# Patient Record
Sex: Female | Born: 1965 | Race: White | Hispanic: No | Smoking: Former smoker
Health system: Southern US, Community
[De-identification: ages and names within clinical notes are randomized; demographics above are authoritative.]

## PROBLEM LIST (undated history)

## (undated) DIAGNOSIS — I341 Nonrheumatic mitral (valve) prolapse: Secondary | ICD-10-CM

## (undated) DIAGNOSIS — I493 Ventricular premature depolarization: Secondary | ICD-10-CM

## (undated) DIAGNOSIS — D689 Coagulation defect, unspecified: Secondary | ICD-10-CM

## (undated) DIAGNOSIS — D6852 Prothrombin gene mutation: Secondary | ICD-10-CM

## (undated) DIAGNOSIS — T7840XA Allergy, unspecified, initial encounter: Secondary | ICD-10-CM

## (undated) DIAGNOSIS — B009 Herpesviral infection, unspecified: Secondary | ICD-10-CM

## (undated) DIAGNOSIS — F419 Anxiety disorder, unspecified: Secondary | ICD-10-CM

## (undated) DIAGNOSIS — S92909A Unspecified fracture of unspecified foot, initial encounter for closed fracture: Secondary | ICD-10-CM

## (undated) DIAGNOSIS — S62609A Fracture of unspecified phalanx of unspecified finger, initial encounter for closed fracture: Secondary | ICD-10-CM

## (undated) HISTORY — PX: HAND SURGERY: SHX662

## (undated) HISTORY — DX: Allergy, unspecified, initial encounter: T78.40XA

## (undated) HISTORY — PX: TONSILLECTOMY: SUR1361

## (undated) HISTORY — PX: HERNIA REPAIR: SHX51

## (undated) HISTORY — PX: COLONOSCOPY: SHX174

## (undated) HISTORY — PX: BUNIONECTOMY: SHX129

## (undated) HISTORY — DX: Coagulation defect, unspecified: D68.9

## (undated) HISTORY — DX: Prothrombin gene mutation: D68.52

## (undated) HISTORY — PX: PELVIC LAPAROSCOPY: SHX162

## (undated) HISTORY — DX: Nonrheumatic mitral (valve) prolapse: I34.1

## (undated) HISTORY — DX: Anxiety disorder, unspecified: F41.9

## (undated) HISTORY — DX: Fracture of unspecified phalanx of unspecified finger, initial encounter for closed fracture: S62.609A

## (undated) HISTORY — DX: Herpesviral infection, unspecified: B00.9

## (undated) HISTORY — DX: Unspecified fracture of unspecified foot, initial encounter for closed fracture: S92.909A

## (undated) HISTORY — PX: INGUINAL HERNIA REPAIR: SHX194

---

## 1998-12-22 ENCOUNTER — Other Ambulatory Visit: Admission: RE | Admit: 1998-12-22 | Discharge: 1998-12-22 | Payer: Self-pay | Admitting: Gynecology

## 1998-12-27 ENCOUNTER — Other Ambulatory Visit: Admission: RE | Admit: 1998-12-27 | Discharge: 1998-12-27 | Payer: Self-pay | Admitting: Gynecology

## 1999-01-17 ENCOUNTER — Ambulatory Visit (HOSPITAL_COMMUNITY): Admission: RE | Admit: 1999-01-17 | Discharge: 1999-01-17 | Payer: Self-pay | Admitting: Gynecology

## 1999-01-17 ENCOUNTER — Encounter: Payer: Self-pay | Admitting: Gynecology

## 2000-05-21 ENCOUNTER — Other Ambulatory Visit: Admission: RE | Admit: 2000-05-21 | Discharge: 2000-05-21 | Payer: Self-pay | Admitting: Gynecology

## 2001-01-12 ENCOUNTER — Ambulatory Visit (HOSPITAL_COMMUNITY): Admission: RE | Admit: 2001-01-12 | Discharge: 2001-01-12 | Payer: Self-pay | Admitting: Emergency Medicine

## 2001-05-01 ENCOUNTER — Ambulatory Visit (HOSPITAL_COMMUNITY): Admission: RE | Admit: 2001-05-01 | Discharge: 2001-05-01 | Payer: Self-pay | Admitting: Gynecology

## 2001-06-03 ENCOUNTER — Other Ambulatory Visit: Admission: RE | Admit: 2001-06-03 | Discharge: 2001-06-03 | Payer: Self-pay | Admitting: Gynecology

## 2001-06-09 ENCOUNTER — Emergency Department (HOSPITAL_COMMUNITY): Admission: EM | Admit: 2001-06-09 | Discharge: 2001-06-09 | Payer: Self-pay | Admitting: *Deleted

## 2001-06-09 ENCOUNTER — Encounter: Payer: Self-pay | Admitting: Emergency Medicine

## 2002-11-18 ENCOUNTER — Other Ambulatory Visit: Admission: RE | Admit: 2002-11-18 | Discharge: 2002-11-18 | Payer: Self-pay | Admitting: Gynecology

## 2003-04-06 ENCOUNTER — Encounter: Payer: Self-pay | Admitting: Emergency Medicine

## 2003-04-06 ENCOUNTER — Emergency Department (HOSPITAL_COMMUNITY): Admission: EM | Admit: 2003-04-06 | Discharge: 2003-04-06 | Payer: Self-pay | Admitting: Emergency Medicine

## 2003-11-29 ENCOUNTER — Other Ambulatory Visit: Admission: RE | Admit: 2003-11-29 | Discharge: 2003-11-29 | Payer: Self-pay | Admitting: Gynecology

## 2005-01-24 ENCOUNTER — Other Ambulatory Visit: Admission: RE | Admit: 2005-01-24 | Discharge: 2005-01-24 | Payer: Self-pay | Admitting: Gynecology

## 2005-05-29 ENCOUNTER — Encounter: Admission: RE | Admit: 2005-05-29 | Discharge: 2005-05-29 | Payer: Self-pay | Admitting: Internal Medicine

## 2005-11-14 ENCOUNTER — Encounter: Admission: RE | Admit: 2005-11-14 | Discharge: 2005-11-14 | Payer: Self-pay | Admitting: Internal Medicine

## 2005-12-03 ENCOUNTER — Encounter (INDEPENDENT_AMBULATORY_CARE_PROVIDER_SITE_OTHER): Payer: Self-pay | Admitting: *Deleted

## 2005-12-03 ENCOUNTER — Ambulatory Visit (HOSPITAL_BASED_OUTPATIENT_CLINIC_OR_DEPARTMENT_OTHER): Admission: RE | Admit: 2005-12-03 | Discharge: 2005-12-03 | Payer: Self-pay | Admitting: Orthopedic Surgery

## 2006-03-20 ENCOUNTER — Other Ambulatory Visit: Admission: RE | Admit: 2006-03-20 | Discharge: 2006-03-20 | Payer: Self-pay | Admitting: Gynecology

## 2006-07-21 ENCOUNTER — Ambulatory Visit: Payer: Self-pay | Admitting: Family Medicine

## 2006-07-22 ENCOUNTER — Ambulatory Visit: Payer: Self-pay | Admitting: Family Medicine

## 2006-07-23 ENCOUNTER — Encounter: Admission: RE | Admit: 2006-07-23 | Discharge: 2006-07-23 | Payer: Self-pay | Admitting: Family Medicine

## 2006-07-30 ENCOUNTER — Ambulatory Visit: Payer: Self-pay | Admitting: Family Medicine

## 2006-07-31 ENCOUNTER — Ambulatory Visit: Payer: Self-pay

## 2006-07-31 ENCOUNTER — Encounter: Payer: Self-pay | Admitting: Cardiology

## 2007-03-24 ENCOUNTER — Other Ambulatory Visit: Admission: RE | Admit: 2007-03-24 | Discharge: 2007-03-24 | Payer: Self-pay | Admitting: Gynecology

## 2007-06-30 ENCOUNTER — Encounter: Admission: RE | Admit: 2007-06-30 | Discharge: 2007-06-30 | Payer: Self-pay | Admitting: Gynecology

## 2008-04-29 ENCOUNTER — Encounter: Admission: RE | Admit: 2008-04-29 | Discharge: 2008-04-29 | Payer: Self-pay | Admitting: Gynecology

## 2008-06-15 ENCOUNTER — Encounter: Admission: RE | Admit: 2008-06-15 | Discharge: 2008-07-05 | Payer: Self-pay | Admitting: Orthopedic Surgery

## 2008-06-22 ENCOUNTER — Encounter: Payer: Self-pay | Admitting: Gynecology

## 2008-06-22 ENCOUNTER — Other Ambulatory Visit: Admission: RE | Admit: 2008-06-22 | Discharge: 2008-06-22 | Payer: Self-pay | Admitting: Gynecology

## 2008-06-22 ENCOUNTER — Ambulatory Visit: Payer: Self-pay | Admitting: Gynecology

## 2009-06-30 ENCOUNTER — Ambulatory Visit: Payer: Self-pay | Admitting: Gynecology

## 2009-06-30 ENCOUNTER — Other Ambulatory Visit: Admission: RE | Admit: 2009-06-30 | Discharge: 2009-06-30 | Payer: Self-pay | Admitting: Gynecology

## 2009-06-30 ENCOUNTER — Encounter: Payer: Self-pay | Admitting: Gynecology

## 2009-07-11 ENCOUNTER — Encounter: Admission: RE | Admit: 2009-07-11 | Discharge: 2009-07-11 | Payer: Self-pay | Admitting: Gynecology

## 2009-09-20 ENCOUNTER — Ambulatory Visit: Payer: Self-pay | Admitting: Gynecology

## 2010-07-13 ENCOUNTER — Encounter: Admission: RE | Admit: 2010-07-13 | Discharge: 2010-07-13 | Payer: Self-pay | Admitting: Gynecology

## 2010-07-17 ENCOUNTER — Ambulatory Visit: Payer: Self-pay | Admitting: Gynecology

## 2010-07-17 ENCOUNTER — Other Ambulatory Visit: Admission: RE | Admit: 2010-07-17 | Discharge: 2010-07-17 | Payer: Self-pay | Admitting: Gynecology

## 2010-11-11 ENCOUNTER — Encounter: Payer: Self-pay | Admitting: Internal Medicine

## 2011-01-14 ENCOUNTER — Ambulatory Visit (INDEPENDENT_AMBULATORY_CARE_PROVIDER_SITE_OTHER): Payer: 59 | Admitting: Gynecology

## 2011-01-14 DIAGNOSIS — K4090 Unilateral inguinal hernia, without obstruction or gangrene, not specified as recurrent: Secondary | ICD-10-CM

## 2011-01-14 DIAGNOSIS — R1032 Left lower quadrant pain: Secondary | ICD-10-CM

## 2011-01-15 ENCOUNTER — Other Ambulatory Visit: Payer: 59

## 2011-01-15 ENCOUNTER — Ambulatory Visit (INDEPENDENT_AMBULATORY_CARE_PROVIDER_SITE_OTHER): Payer: 59 | Admitting: Gynecology

## 2011-01-15 DIAGNOSIS — N72 Inflammatory disease of cervix uteri: Secondary | ICD-10-CM

## 2011-01-15 DIAGNOSIS — N949 Unspecified condition associated with female genital organs and menstrual cycle: Secondary | ICD-10-CM

## 2011-01-15 DIAGNOSIS — R1032 Left lower quadrant pain: Secondary | ICD-10-CM

## 2011-01-15 DIAGNOSIS — D3910 Neoplasm of uncertain behavior of unspecified ovary: Secondary | ICD-10-CM

## 2011-02-13 ENCOUNTER — Encounter (INDEPENDENT_AMBULATORY_CARE_PROVIDER_SITE_OTHER): Payer: 59 | Admitting: Vascular Surgery

## 2011-02-13 DIAGNOSIS — I83893 Varicose veins of bilateral lower extremities with other complications: Secondary | ICD-10-CM

## 2011-02-14 NOTE — Consult Note (Signed)
NEW PATIENT CONSULTATION  Schnick, Andreyah L DOB:  1966-10-01                                       02/13/2011 EAVWU#:[id number]  Patient presents today for evaluation of venous pathology in her lower extremities.  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  She does not have any history of DVT and no history of swelling.  She has not had any hemorrhage from these.  She does have a very strong maternal family history of venous varicosities requiring multiple surgeries and also has a history of lower extremity thrombosis in both her mother and her father.  PAST MEDICAL HISTORY:  Significant for bunionectomies in the past, hernia repair, endometriosis, laparoscopic repair, and tonsillectomy.  SOCIAL HISTORY:  She is single.  She has 1 child.  She works as a Psychologist, prison and probation services.  She does not smoke, having quit 4 years ago, and does have occasional wine with dinner.  REVIEW OF SYSTEMS:  No history of weight loss or gain.  She weighs 140 pounds.  She is 5 feet 6 inches tall.  She does have pain in her popliteal fossa with prolonged standing, otherwise review of systems is completely negative.  MEDICATIONS:  Zyrtec as needed.  ALLERGIES:  CODEINE.  PHYSICAL EXAMINATION:  A well-developed and well-nourished white female appearing her stated age in no acute distress.  Blood pressure 112/63, pulse 73, respirations 18.  HEENT:  Normal.  Her radial and dorsalis pedis pulses are 2+ bilaterally.  Musculoskeletal:  No major deformities or cyanosis.  Neurologic:  No focal weakness or paresthesias.  Skin without ulcers or rashes.  She does have a typical pattern of branching and distribution of telangiectasia on both bilateral thighs.  She also has a very prominent nest of telangiectasia behind  her left popliteal fossa.  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  I imaged her saphenous veins in the area of the popliteal fossa with handheld SonoSite.  This shows no evidence of varicosities in her popliteal fossae bilaterally with no evidence of reflux in her small saphenous vein.  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  I discussed this at length with patient.  I do not see any relationship between the telangiectasia or the discomfort and her reflux.  I explained that the saphenous vein could be treated but certainly would recommend against this, since I do not see any symptoms related to this. I explained that this is progressive, and over time this may require treatment but certainly nothing currently.  Regarding the telangiectasia, I did explain treatment options with sclerotherapy.  She had attempted sclerotherapy with hypertonic saline in the past, but it was very uncomfortable.  I did not feel that she had a good result.  I explained that we would treat her with a different solution that would not have the pain with instillation, and should have a better result. She is interested in pursuing this and will call to schedule with Clementeen Hoof, RN.    Larina Earthly, M.D. Electronically Signed  TFE/MEDQ  D:  02/13/2011  T:  02/14/2011  Job:  [id number]

## 2011-02-27 ENCOUNTER — Encounter (INDEPENDENT_AMBULATORY_CARE_PROVIDER_SITE_OTHER): Payer: Self-pay | Admitting: Surgery

## 2011-03-08 NOTE — Op Note (Signed)
[name], [name]              ACCOUNT NO.:  [id number]   MEDICAL RECORD NO.:  [id number]          PATIENT TYPE:  AMB   LOCATION:  [facility]SC                          FACILITY:  [facility]   PHYSICIAN:  Cindee Salt, M.D.       DATE OF BIRTH:  02/09/1966   DATE OF PROCEDURE:  12/03/2005  DATE OF DISCHARGE:                                 OPERATIVE REPORT   PREOPERATIVE DIAGNOSIS:  Chronic paronychia left middle finger.   POSTOPERATIVE DIAGNOSIS:  Chronic paronychia left middle finger.   OPERATION:  Incision and drainage, marsupialization with cultures, left  middle finger paronychia.   SURGEON:  Cindee Salt, M.D.   ASSISTANTCarolyne Fiscal   ANESTHESIA:  Metacarpal block.   HISTORY:  The patient is a 45 year old female with a history of a chronic  paronychia.  This has been drained on at least one occasion.  She has had  multiple antibiotics.  This has persisted.  MRI reveals inflammation along  the paronychial fold of her left middle finger.   PROCEDURE:  The patient is brought to the operating room where a metacarpal  block was given with 1% Xylocaine without epinephrine.  She was prepped  using DuraPrep, supine position, left arm free.  After adequate anesthesia  was afforded the finger was exsanguinated from the DIP joint proximally.  A  Penrose drain was used for tourniquet control based the finger.  An  elliptical incision of the skin was then made, cultures were taken.  The  specimen was sent to pathology.  No gross purulent material was encountered.  Cultures were sent for both aerobic, anaerobic, fungal and acid-fast  bacteria.  The area was then copiously irrigated with saline.  The area was  left open.  A sterile compressive dressing and splint was applied.  The  patient tolerated the procedure well.  She is discharged home to return to  the [facility] [place] in three days on Vicodin.           ______________________________  Cindee Salt, M.D.     GK/MEDQ  D:   12/03/2005  T:  12/03/2005  Job:  [id number]

## 2011-07-05 ENCOUNTER — Other Ambulatory Visit: Payer: Self-pay | Admitting: Gynecology

## 2011-07-05 DIAGNOSIS — Z1231 Encounter for screening mammogram for malignant neoplasm of breast: Secondary | ICD-10-CM

## 2011-07-15 ENCOUNTER — Ambulatory Visit
Admission: RE | Admit: 2011-07-15 | Discharge: 2011-07-15 | Disposition: A | Discharge status: Home / Self Care | Payer: 59 | Source: Ambulatory Visit | Attending: Gynecology | Admitting: Gynecology

## 2011-07-15 DIAGNOSIS — Z1231 Encounter for screening mammogram for malignant neoplasm of breast: Secondary | ICD-10-CM

## 2011-07-23 DIAGNOSIS — B009 Herpesviral infection, unspecified: Secondary | ICD-10-CM | POA: Insufficient documentation

## 2011-07-25 ENCOUNTER — Other Ambulatory Visit: Payer: Self-pay | Admitting: Gynecology

## 2011-07-29 ENCOUNTER — Encounter: Payer: Self-pay | Admitting: Gynecology

## 2011-07-29 ENCOUNTER — Other Ambulatory Visit (HOSPITAL_COMMUNITY)
Admission: RE | Admit: 2011-07-29 | Discharge: 2011-07-29 | Disposition: A | Discharge status: Home / Self Care | Payer: 59 | Source: Ambulatory Visit | Attending: Gynecology | Admitting: Gynecology

## 2011-07-29 ENCOUNTER — Ambulatory Visit (INDEPENDENT_AMBULATORY_CARE_PROVIDER_SITE_OTHER): Payer: 59 | Admitting: Gynecology

## 2011-07-29 VITALS — BP 130/80 | Ht 66.5 in | Wt 148.0 lb

## 2011-07-29 DIAGNOSIS — E7800 Pure hypercholesterolemia, unspecified: Secondary | ICD-10-CM

## 2011-07-29 DIAGNOSIS — Z01419 Encounter for gynecological examination (general) (routine) without abnormal findings: Secondary | ICD-10-CM | POA: Insufficient documentation

## 2011-07-29 DIAGNOSIS — R635 Abnormal weight gain: Secondary | ICD-10-CM

## 2011-07-29 DIAGNOSIS — Z833 Family history of diabetes mellitus: Secondary | ICD-10-CM

## 2011-07-29 NOTE — Patient Instructions (Signed)
Will call you if any labs are abnormal

## 2011-07-29 NOTE — Progress Notes (Signed)
Jessica [name] 11-10-1965 [id number]   History:    45 y.o.  for annual exam and asymptomatic. Strong family history of DM. 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 Had colonoscopy with Dr, Loreta Ave 2010. Does monthly SBE. Regular cycles  Past medical history,surgical history, family history and social history were all reviewed and documented in the EPIC chart.  ROS:  Was performed and pertinent positives and negatives are included in the history.  Exam: chaperone present BP 130/80  Ht 5' 6.5" (1.689 m)  Wt 148 lb (67.132 kg)  BMI 23.53 kg/m2  LMP 07/23/2011  Body mass index is 23.53 kg/(m^2).  General appearance : Well developed well nourished female. No acute distress HEENT: Neck supple, trachea midline, no carotid bruits, no thyroidmegaly Lungs: Clear to auscultation, no rhonchi or wheezes, or rib retractions  Heart: Regular rate and rhythm, no murmurs or gallops Breast:Examined in sitting and supine position were symmetrical in appearance, no palpable masses or tenderness,  no skin retraction, no nipple inversion, no nipple discharge, no skin discoloration, no axillary or supraclavicular lymphadenopathy Abdomen: no palpable masses or tenderness, no rebound or guarding Extremities: no edema or skin discoloration or tenderness  Pelvic:  Bartholin, Urethra, Skene Glands: Within normal limits             Vagina: No gross lesions or discharge  Cervix: No gross lesions or discharge  Uterus  av, normal size, shape and consistency, non-tender and mobile  Adnexa  Without masses or tenderness  Anus and perineum  normal   Rectovaginal  normal sphincter tone without palpated masses or tenderness             Hemoccult not done     Assessment/Plan:  45 y.o. female for annual exam unremarkable. PaP done. Labs: CBC,TSH,BS,U/A and cholesterol. Colonoscopy q 5 years. Rec.monthly SBE. Will give fecal occult cards to submit to office for  testing.    Ok Edwards MD, 7:45 PM 07/29/2011

## 2011-07-30 ENCOUNTER — Encounter: Payer: Self-pay | Admitting: *Deleted

## 2011-07-30 ENCOUNTER — Other Ambulatory Visit: Payer: Self-pay | Admitting: *Deleted

## 2011-07-30 NOTE — Progress Notes (Signed)
  Fecal blood hemocult mailed to pt as requested by Dr. Lily Peer

## 2012-06-17 ENCOUNTER — Other Ambulatory Visit: Payer: Self-pay | Admitting: Gynecology

## 2012-06-17 DIAGNOSIS — Z1231 Encounter for screening mammogram for malignant neoplasm of breast: Secondary | ICD-10-CM

## 2012-07-15 ENCOUNTER — Ambulatory Visit
Admission: RE | Admit: 2012-07-15 | Discharge: 2012-07-15 | Disposition: A | Discharge status: Home / Self Care | Payer: 59 | Source: Ambulatory Visit | Attending: Gynecology | Admitting: Gynecology

## 2012-07-15 DIAGNOSIS — Z1231 Encounter for screening mammogram for malignant neoplasm of breast: Secondary | ICD-10-CM

## 2012-07-22 DIAGNOSIS — Z23 Encounter for immunization: Secondary | ICD-10-CM

## 2012-07-29 ENCOUNTER — Encounter: Payer: 59 | Admitting: Gynecology

## 2012-08-04 ENCOUNTER — Ambulatory Visit (INDEPENDENT_AMBULATORY_CARE_PROVIDER_SITE_OTHER): Payer: 59 | Admitting: Gynecology

## 2012-08-04 ENCOUNTER — Encounter: Payer: Self-pay | Admitting: Gynecology

## 2012-08-04 VITALS — BP 118/76 | Ht 66.5 in | Wt 148.0 lb

## 2012-08-04 DIAGNOSIS — Z01419 Encounter for gynecological examination (general) (routine) without abnormal findings: Secondary | ICD-10-CM

## 2012-08-04 LAB — CBC WITH DIFFERENTIAL/PLATELET
Basophils Absolute: 0 10*3/uL (ref 0.0–0.1)
Basophils Relative: 0 % (ref 0–1)
Eosinophils Absolute: 0.1 10*3/uL (ref 0.0–0.7)
Hemoglobin: 14 g/dL (ref 12.0–15.0)
Lymphocytes Relative: 32 % (ref 12–46)
Lymphs Abs: 2.3 10*3/uL (ref 0.7–4.0)
MCHC: 33.9 g/dL (ref 30.0–36.0)
MCV: 92.4 fL (ref 78.0–100.0)
Monocytes Absolute: 0.3 10*3/uL (ref 0.1–1.0)
Monocytes Relative: 5 % (ref 3–12)
Neutro Abs: 4.6 10*3/uL (ref 1.7–7.7)
Platelets: 243 10*3/uL (ref 150–400)
RBC: 4.47 MIL/uL (ref 3.87–5.11)
WBC: 7.4 10*3/uL (ref 4.0–10.5)

## 2012-08-04 MED ORDER — VALACYCLOVIR HCL 1 G PO TABS: ORAL_TABLET | ORAL | Status: DC

## 2012-08-04 NOTE — Patient Instructions (Addendum)

## 2012-08-04 NOTE — Progress Notes (Signed)
Jessica [name] 18-Aug-1966 [id number]   History:    46 y.o.  for annual gyn exam with no complaints. 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 Review of patient's records indicated she has a family history of colon cancer whereby her her grandfather and uncles and aunts have had colon cancer. Patient had a normal colonoscopy in 2010 and will need a followup colonoscopy 5 years from that date. Also patient has a great aunt as well as her sister with history of breast cancer and patient has been offered in the past undergo BRCA one BRCA2 testing but has refused. She does her monthly self breast examination her mammogram was this year which was normal. She has  always had normal Pap smears in the past  And her last normal Pap smear was in 2012.  Past medical history,surgical history, family history and social history were all reviewed and documented in the EPIC chart.  Gynecologic History Patient's last menstrual period was 07/20/2012. Contraception: none Last Pap: 2012. Results were: normal Last mammogram: 2013. Results were: normal  Obstetric History OB History    Grav Para Term Preterm Abortions TAB SAB Ect Mult Living   2 1   1  1   1      # Outc Date GA Lbr Len/2nd Wgt Sex Del Anes PTL Lv   1 PAR     M SVD      2 SAB                ROS: A ROS was performed and pertinent positives and negatives are included in the history.  GENERAL: No fevers or chills. HEENT: No change in vision, no earache, sore throat or sinus congestion. NECK: No pain or stiffness. CARDIOVASCULAR: No chest pain or pressure. No palpitations. PULMONARY: No shortness of breath, cough or wheeze. GASTROINTESTINAL: No abdominal pain, nausea, vomiting or diarrhea, melena or bright red blood per rectum. GENITOURINARY: No urinary frequency, urgency, hesitancy or dysuria. MUSCULOSKELETAL: No joint or muscle pain, no back pain, no recent trauma. DERMATOLOGIC: No rash, no itching, no  lesions. ENDOCRINE: No polyuria, polydipsia, no heat or cold intolerance. No recent change in weight. HEMATOLOGICAL: No anemia or easy bruising or bleeding. NEUROLOGIC: No headache, seizures, numbness, tingling or weakness. PSYCHIATRIC: No depression, no loss of interest in normal activity or change in sleep pattern.     Exam: chaperone present  BP 118/76  Ht 5' 6.5" (1.689 m)  Wt 148 lb (67.132 kg)  BMI 23.53 kg/m2  LMP 07/20/2012  Body mass index is 23.53 kg/(m^2).  General appearance : Well developed well nourished female. No acute distress HEENT: Neck supple, trachea midline, no carotid bruits, no thyroidmegaly Lungs: Clear to auscultation, no rhonchi or wheezes, or rib retractions  Heart: Regular rate and rhythm, no murmurs or gallops Breast:Examined in sitting and supine position were symmetrical in appearance, no palpable masses or tenderness,  no skin retraction, no nipple inversion, no nipple discharge, no skin discoloration, no axillary or supraclavicular lymphadenopathy Abdomen: no palpable masses or tenderness, no rebound or guarding Extremities: no edema or skin discoloration or tenderness  Pelvic:  Bartholin, Urethra, Skene Glands: Within normal limits             Vagina: No gross lesions or discharge  Cervix: No gross lesions or discharge  Uterus  anteverted, normal size, shape and consistency, non-tender and mobile  Adnexa  Without masses or tenderness  Anus and perineum  normal  Rectovaginal  normal sphincter tone without palpated masses or tenderness             Hemoccult cards provided     Assessment/Plan:  46 y.o. female for annual exam was reminded to do her monthly self breast examination. Once again she was offered BRCA one BRCA2 testing but she is going to check with her sister to see if she was tested (1 it had breast cancer). The following labs were drawn today: CBC, cholesterol, urinalysis, blood sugar. New Pap smear screening guidelines discussed. No Pap  smear done this year. She was given Hemoccult cards to submit to the office for testing. Prescription refill for Vitrax 500 mg to take 1 by mouth daily was provided.    Ok Edwards MD, 9:03 PM 08/04/2012

## 2012-08-05 ENCOUNTER — Encounter: Payer: Self-pay | Admitting: Gynecology

## 2012-08-05 LAB — GLUCOSE, RANDOM: Glucose, Bld: 86 mg/dL (ref 70–99)

## 2012-08-06 LAB — URINALYSIS W MICROSCOPIC + REFLEX CULTURE
Casts: NONE SEEN
Hgb urine dipstick: NEGATIVE
Protein, ur: NEGATIVE mg/dL
Urobilinogen, UA: 0.2 mg/dL (ref 0.0–1.0)

## 2012-08-08 LAB — URINE CULTURE: Colony Count: >=100000

## 2012-08-11 ENCOUNTER — Other Ambulatory Visit: Payer: Self-pay | Admitting: Gynecology

## 2012-08-11 DIAGNOSIS — N390 Urinary tract infection, site not specified: Secondary | ICD-10-CM

## 2012-08-11 MED ORDER — NITROFURANTOIN MONOHYD MACRO 100 MG PO CAPS: 100.0000 mg | ORAL_CAPSULE | Freq: Two times a day (BID) | ORAL | Status: AC

## 2012-08-12 ENCOUNTER — Encounter: Payer: Self-pay | Admitting: Gynecology

## 2012-09-08 ENCOUNTER — Encounter: Payer: Self-pay | Admitting: Gynecology

## 2012-09-08 ENCOUNTER — Ambulatory Visit (INDEPENDENT_AMBULATORY_CARE_PROVIDER_SITE_OTHER): Payer: 59 | Admitting: Gynecology

## 2012-09-08 VITALS — BP 128/82

## 2012-09-08 DIAGNOSIS — N390 Urinary tract infection, site not specified: Secondary | ICD-10-CM

## 2012-09-08 DIAGNOSIS — A499 Bacterial infection, unspecified: Secondary | ICD-10-CM

## 2012-09-08 DIAGNOSIS — N760 Acute vaginitis: Secondary | ICD-10-CM

## 2012-09-08 DIAGNOSIS — N898 Other specified noninflammatory disorders of vagina: Secondary | ICD-10-CM

## 2012-09-08 DIAGNOSIS — B9689 Other specified bacterial agents as the cause of diseases classified elsewhere: Secondary | ICD-10-CM

## 2012-09-08 DIAGNOSIS — L293 Anogenital pruritus, unspecified: Secondary | ICD-10-CM

## 2012-09-08 LAB — WET PREP FOR TRICH, YEAST, CLUE
Trich, Wet Prep: NONE SEEN
Yeast Wet Prep HPF POC: NONE SEEN

## 2012-09-08 LAB — URINALYSIS W MICROSCOPIC + REFLEX CULTURE
Bilirubin Urine: NEGATIVE
Glucose, UA: NEGATIVE mg/dL
Ketones, ur: NEGATIVE mg/dL
Protein, ur: NEGATIVE mg/dL

## 2012-09-08 MED ORDER — METRONIDAZOLE 0.75 % VA GEL: VAGINAL | Status: DC

## 2012-09-08 MED ORDER — NONFORMULARY OR COMPOUNDED ITEM: Status: DC

## 2012-09-08 NOTE — Patient Instructions (Signed)
Bacterial Vaginosis Bacterial vaginosis (BV) is a vaginal infection where the normal balance of bacteria in the vagina is disrupted. The normal balance is then replaced by an overgrowth of certain bacteria. There are several different kinds of bacteria that can cause BV. BV is the most common vaginal infection in women of childbearing age. CAUSES   The cause of BV is not fully understood. BV develops when there is an increase or imbalance of harmful bacteria.  Some activities or behaviors can upset the normal balance of bacteria in the vagina and put women at increased risk including:  Having a new sex partner or multiple sex partners.  Douching.  Using an intrauterine device (IUD) for contraception.  It is not clear what role sexual activity plays in the development of BV. However, women that have never had sexual intercourse are rarely infected with BV. Women do not get BV from toilet seats, bedding, swimming pools or from touching objects around them.  SYMPTOMS   Grey vaginal discharge.  A fish-like odor with discharge, especially after sexual intercourse.  Itching or burning of the vagina and vulva.  Burning or pain with urination.  Some women have no signs or symptoms at all. DIAGNOSIS  Your caregiver must examine the vagina for signs of BV. Your caregiver will perform lab tests and look at the sample of vaginal fluid through a microscope. They will look for bacteria and abnormal cells (clue cells), a pH test higher than 4.5, and a positive amine test all associated with BV.  RISKS AND COMPLICATIONS   Pelvic inflammatory disease (PID).  Infections following gynecology surgery.  Developing HIV.  Developing herpes virus. TREATMENT  Sometimes BV will clear up without treatment. However, all women with symptoms of BV should be treated to avoid complications, especially if gynecology surgery is planned. Female partners generally do not need to be treated. However, BV may spread  between female sex partners so treatment is helpful in preventing a recurrence of BV.   BV may be treated with antibiotics. The antibiotics come in either pill or vaginal cream forms. Either can be used with nonpregnant or pregnant women, but the recommended dosages differ. These antibiotics are not harmful to the baby.  BV can recur after treatment. If this happens, a second round of antibiotics will often be prescribed.  Treatment is important for pregnant women. If not treated, BV can cause a premature delivery, especially for a pregnant woman who had a premature birth in the past. All pregnant women who have symptoms of BV should be checked and treated.  For chronic reoccurrence of BV, treatment with a type of prescribed gel vaginally twice a week is helpful. HOME CARE INSTRUCTIONS   Finish all medication as directed by your caregiver.  Do not have sex until treatment is completed.  Tell your sexual partner that you have a vaginal infection. They should see their caregiver and be treated if they have problems, such as a mild rash or itching.  Practice safe sex. Use condoms. Only have 1 sex partner. PREVENTION  Basic prevention steps can help reduce the risk of upsetting the natural balance of bacteria in the vagina and developing BV:  Do not have sexual intercourse (be abstinent).  Do not douche.  Use all of the medicine prescribed for treatment of BV, even if the signs and symptoms go away.  Tell your sex partner if you have BV. That way, they can be treated, if needed, to prevent reoccurrence. SEEK MEDICAL CARE IF:     Your symptoms are not improving after 3 days of treatment.  You have increased discharge, pain, or fever. MAKE SURE YOU:   Understand these instructions.  Will watch your condition.  Will get help right away if you are not doing well or get worse. FOR MORE INFORMATION  Division of STD Prevention (DSTDP), Centers for Disease Control and Prevention:  www.cdc.gov/std American Social Health Association (ASHA): www.ashastd.org  Document Released: 10/07/2005 Document Revised: 12/30/2011 Document Reviewed: 03/30/2009 ExitCare Patient Information 2013 [employer].  

## 2012-09-08 NOTE — Progress Notes (Signed)
Patient presented to the office today complaining of vulvar pruritus. She was recently treated for urinary tract infection. She did try over-the-counter Monistat with no relief.  Exam: Bartholin urethra Skene glands within normal limits Vagina: No lesions small clear-like discharge Cervix: No lesions or discharge Bimanual exam not done Rectal exam not done  Urinalysis: Negative Wet prep: Moderate clue cells, rare WBC, too numerous to count bacteria  Assessment/plan: Patient with apparent bacterial vaginosis. Patient not sexually active. Patient be placed on MetroGel to apply each bedtime for 5 nights. 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

## 2012-09-11 ENCOUNTER — Emergency Department (HOSPITAL_BASED_OUTPATIENT_CLINIC_OR_DEPARTMENT_OTHER)
Admission: EM | Admit: 2012-09-11 | Discharge: 2012-09-11 | Disposition: A | Discharge status: Home / Self Care | Payer: 59 | Attending: Emergency Medicine | Admitting: Emergency Medicine

## 2012-09-11 ENCOUNTER — Encounter (HOSPITAL_BASED_OUTPATIENT_CLINIC_OR_DEPARTMENT_OTHER): Payer: Self-pay | Admitting: *Deleted

## 2012-09-11 DIAGNOSIS — Z8742 Personal history of other diseases of the female genital tract: Secondary | ICD-10-CM | POA: Insufficient documentation

## 2012-09-11 DIAGNOSIS — B009 Herpesviral infection, unspecified: Secondary | ICD-10-CM | POA: Insufficient documentation

## 2012-09-11 DIAGNOSIS — Z791 Long term (current) use of non-steroidal anti-inflammatories (NSAID): Secondary | ICD-10-CM | POA: Insufficient documentation

## 2012-09-11 DIAGNOSIS — Z87891 Personal history of nicotine dependence: Secondary | ICD-10-CM | POA: Insufficient documentation

## 2012-09-11 DIAGNOSIS — R002 Palpitations: Secondary | ICD-10-CM | POA: Insufficient documentation

## 2012-09-11 DIAGNOSIS — Z792 Long term (current) use of antibiotics: Secondary | ICD-10-CM | POA: Insufficient documentation

## 2012-09-11 DIAGNOSIS — Z79899 Other long term (current) drug therapy: Secondary | ICD-10-CM | POA: Insufficient documentation

## 2012-09-11 LAB — BASIC METABOLIC PANEL
BUN: 11 mg/dL (ref 6–23)
CO2: 27 mEq/L (ref 19–32)
Calcium: 9.3 mg/dL (ref 8.4–10.5)
Chloride: 104 mEq/L (ref 96–112)
Creatinine, Ser: 0.7 mg/dL (ref 0.50–1.10)
GFR calc Af Amer: >90 mL/min (ref >90)
GFR calc non Af Amer: >90 mL/min (ref >90)
Glucose, Bld: 102 mg/dL — ABNORMAL HIGH (ref 70–99)
Potassium: 4.2 mEq/L (ref 3.5–5.1)
Sodium: 140 mEq/L (ref 135–145)

## 2012-09-11 LAB — CBC
HCT: 37 % (ref 36.0–46.0)
Hemoglobin: 13.1 g/dL (ref 12.0–15.0)
MCH: 32 pg (ref 26.0–34.0)
MCHC: 35.4 g/dL (ref 30.0–36.0)
MCV: 90.5 fL (ref 78.0–100.0)
Platelets: 192 10*3/uL (ref 150–400)
RBC: 4.09 MIL/uL (ref 3.87–5.11)
RDW: 11.4 % — ABNORMAL LOW (ref 11.5–15.5)
WBC: 7.1 10*3/uL (ref 4.0–10.5)

## 2012-09-11 LAB — MAGNESIUM: Magnesium: 2.1 mg/dL (ref 1.5–2.5)

## 2012-09-11 LAB — PREGNANCY, URINE: Preg Test, Ur: NEGATIVE

## 2012-09-11 LAB — TROPONIN I: Troponin I: <0.3 ng/mL (ref <0.30)

## 2012-09-11 NOTE — ED Notes (Signed)
Patient states she has felt an irregular heartbeat that she can feel several times per hour which is associated with a cough.  Was seen by her PCP and had an EKG which did not show the rhythm.  Is scheduled Dr. Tresa Endo, cardiology on 09/30/12.  Family hx of afib, and is concerned that something will happen prior to her appointment.

## 2012-09-11 NOTE — ED Provider Notes (Signed)
History    46yf with palpitations. Onset about 3w ago. Intermittent. No appreciable exacerbating or relieving factors but does notice more while at rest and when trying to fall asleep. Very brief. No episodes sustained more than a seconds at most. Typically feels a strong beat and then heart stops briefly and then resumes again. Sometimes feels like has to cough just after. Denies CP, SOB, nausea or diaphoresis. No dizziness or lightheadedeness. No unusual leg pain or swelling. Denies hx of underlying lung problems, valvular abnormality or thyroid problems. Former smoker though. No new meds, supplements or OTCs. Actually cut back caffeine intake after symptoms started. Father has afib and sister with "heart valve problem."  CSN: [id number]  Arrival date & time 09/11/12  1743   First MD Initiated Contact with Patient 09/11/12 1748      Chief Complaint  Patient presents with  . Irregular Heart Beat    (Consider location/radiation/quality/duration/timing/severity/associated sxs/prior treatment) HPI  Past Medical History  Diagnosis Date  . Endometriosis   . HSV-2 (herpes simplex virus 2) infection     Past Surgical History  Procedure Date  . Inguinal hernia repair     LEFT  . Tonsillectomy   . Bunionectomy     BOTH FEET  . Pelvic laparoscopy     ENDOMETRIOSIS    Family History  Problem Relation Age of Onset  . Cancer Sister     breast  . Breast cancer Maternal Aunt   . Cancer Maternal Aunt     COLON  . Cancer Paternal Uncle     COLON  . Cancer Maternal Grandmother     OVARIAN  . Diabetes Paternal Grandmother   . Cancer Paternal Grandfather     COLON    History  Substance Use Topics  . Smoking status: Former Smoker    Types: Cigarettes    Quit date: 01/18/2007  . Smokeless tobacco: Never Used  . Alcohol Use: Yes     Comment: 1-2 glasses of wine/day    OB History    Grav Para Term Preterm Abortions TAB SAB Ect Mult Living   2 1   1  1   1       Review of  Systems   Review of symptoms negative unless otherwise noted in HPI.   Allergies  Codeine  Home Medications   Current Outpatient Rx  Name  Route  Sig  Dispense  Refill  . CALCIUM CARBONATE 600 MG PO TABS   Oral   Take 600 mg by mouth 2 (two) times daily with a meal.           . ZYRTEC PO   Oral   Take by mouth daily. 1/2 pill daily          . METRONIDAZOLE 0.75 % VA GEL      Apply q HS for 5 nights   70 g   2   . ONE-DAILY MULTI VITAMINS PO TABS   Oral   Take 1 tablet by mouth daily.           . ALEVE PO   Oral   Take by mouth daily. 2-4 daily          . NONFORMULARY OR COMPOUNDED ITEM      Boric Acid Suppository 600mg  apply qhs for 21 days vaginaly.   21 each   1   . VALACYCLOVIR HCL 1 G PO TABS      Take one daily   30 tablet   6  BP 125/79  Pulse 73  Temp 98.1 F (36.7 C) (Oral)  Resp 18  SpO2 100%  LMP 08/10/2012  Physical Exam  Nursing note and vitals reviewed. Constitutional: She appears well-developed and well-nourished. No distress.  HENT:  Head: Normocephalic and atraumatic.  Eyes: Conjunctivae normal are normal. Right eye exhibits no discharge. Left eye exhibits no discharge.  Neck: Neck supple.  Cardiovascular: Normal rate, regular rhythm and normal heart sounds.  Exam reveals no gallop and no friction rub.   No murmur heard.      No murmur. No ectopy.   Pulmonary/Chest: Effort normal and breath sounds normal. No respiratory distress.  Abdominal: Soft. She exhibits no distension. There is no tenderness.  Musculoskeletal: She exhibits no edema and no tenderness.       Lower extremities symmetric as compared to each other. No calf tenderness. Negative Homan's. No palpable cords.   Neurological: She is alert.  Skin: Skin is warm and dry.  Psychiatric: She has a normal mood and affect. Her behavior is normal. Thought content normal.    ED Course  Procedures (including critical care time)  Labs Reviewed  CBC - Abnormal;  Notable for the following:    RDW 11.4 (*)     All other components within normal limits  BASIC METABOLIC PANEL - Abnormal; Notable for the following:    Glucose, Bld 102 (*)     All other components within normal limits  MAGNESIUM  PREGNANCY, URINE  TROPONIN I  TSH   No results found.  EKG:  Rhythm: Vent. rate 63 BPM PR interval 138 ms QRS duration 92 ms QT/QTc 386/395 ms Axis: normal ST segments: TWI anteriorly   1. Palpitations       MDM  46yF with palpitations. Pt's description of symptoms sounds like PVC and subsequent compensatory pause. No ectopy on auscultation. EKG with normal intervals. Does have TWI anteriorly. TWI noted in V2 on prior EKG from 05/2001 and some flattening in V3.  Pt with no other symptoms concerning for ischemia. Pt already has cardiology follow-up scheduled. May benefit from holter monitor if can be arranged by PCP prior to this.      Raeford Razor, MD 09/11/12 2002

## 2012-09-12 LAB — TSH: TSH: 1.168 u[IU]/mL (ref 0.350–4.500)

## 2012-10-01 ENCOUNTER — Other Ambulatory Visit (HOSPITAL_COMMUNITY): Payer: Self-pay | Admitting: Cardiovascular Disease

## 2012-10-01 DIAGNOSIS — R002 Palpitations: Secondary | ICD-10-CM

## 2012-10-21 ENCOUNTER — Telehealth: Payer: Self-pay | Admitting: Gynecology

## 2012-10-21 NOTE — Telephone Encounter (Signed)
Acute onset of pelvic pain 3 days ago lasting for 2 hours.  Coincided with the onset of her menses.  Pain resolved after and no residual symptoms.  Spoke with friends and concerned she may have ruptured a cyst and possibly actively bleeding.  In the absence of symptoms and currently doing well I reassured the patient not emergency and to call the office in the AM to schedule an appointment with Dr Lily Peer.

## 2012-10-22 ENCOUNTER — Ambulatory Visit (INDEPENDENT_AMBULATORY_CARE_PROVIDER_SITE_OTHER): Payer: 59 | Admitting: Gynecology

## 2012-10-22 ENCOUNTER — Ambulatory Visit (HOSPITAL_COMMUNITY)
Admission: RE | Admit: 2012-10-22 | Discharge: 2012-10-22 | Disposition: A | Discharge status: Home / Self Care | Payer: 59 | Source: Ambulatory Visit | Attending: Cardiovascular Disease | Admitting: Cardiovascular Disease

## 2012-10-22 ENCOUNTER — Encounter: Payer: Self-pay | Admitting: Gynecology

## 2012-10-22 ENCOUNTER — Ambulatory Visit (HOSPITAL_COMMUNITY): Payer: 59

## 2012-10-22 VITALS — BP 110/70

## 2012-10-22 DIAGNOSIS — R102 Pelvic and perineal pain: Secondary | ICD-10-CM

## 2012-10-22 DIAGNOSIS — N949 Unspecified condition associated with female genital organs and menstrual cycle: Secondary | ICD-10-CM

## 2012-10-22 DIAGNOSIS — R002 Palpitations: Secondary | ICD-10-CM

## 2012-10-22 LAB — CBC WITH DIFFERENTIAL/PLATELET
Basophils Relative: 0 % (ref 0–1)
HCT: 40.6 % (ref 36.0–46.0)
Hemoglobin: 13.8 g/dL (ref 12.0–15.0)
Lymphocytes Relative: 32 % (ref 12–46)
MCHC: 34 g/dL (ref 30.0–36.0)
Monocytes Absolute: 0.3 10*3/uL (ref 0.1–1.0)
Monocytes Relative: 5 % (ref 3–12)
Neutro Abs: 3.8 10*3/uL (ref 1.7–7.7)
Neutrophils Relative %: 63 % (ref 43–77)
RBC: 4.35 MIL/uL (ref 3.87–5.11)
WBC: 6.2 10*3/uL (ref 4.0–10.5)

## 2012-10-22 LAB — URINALYSIS W MICROSCOPIC + REFLEX CULTURE
Glucose, UA: NEGATIVE mg/dL
Leukocytes, UA: NEGATIVE
Protein, ur: NEGATIVE mg/dL
Specific Gravity, Urine: 1.01 (ref 1.005–1.030)
Urobilinogen, UA: 0.2 mg/dL (ref 0.0–1.0)

## 2012-10-22 NOTE — Patient Instructions (Addendum)
Ovarian Cyst The ovaries are small organs that are on each side of the uterus. The ovaries are the organs that produce the female hormones, estrogen and progesterone. An ovarian cyst is a sac filled with fluid that can vary in its size. It is normal for a small cyst to form in women who are in the childbearing age and who have menstrual periods. This type of cyst is called a follicle cyst that becomes an ovulation cyst (corpus luteum cyst) after it produces the women's egg. It later goes away on its own if the woman does not become pregnant. There are other kinds of ovarian cysts that may cause problems and may need to be treated. The most serious problem is a cyst with cancer. It should be noted that menopausal women who have an ovarian cyst are at a higher risk of it being a cancer cyst. They should be evaluated very quickly, thoroughly and followed closely. This is especially true in menopausal women because of the high rate of ovarian cancer in women in menopause. CAUSES AND TYPES OF OVARIAN CYSTS:  FUNCTIONAL CYST: The follicle/corpus luteum cyst is a functional cyst that occurs every month during ovulation with the menstrual cycle. They go away with the next menstrual cycle if the woman does not get pregnant. Usually, there are no symptoms with a functional cyst.  ENDOMETRIOMA CYST: This cyst develops from the lining of the uterus tissue. This cyst gets in or on the ovary. It grows every month from the bleeding during the menstrual period. It is also called a "chocolate cyst" because it becomes filled with blood that turns brown. This cyst can cause pain in the lower abdomen during intercourse and with your menstrual period.  CYSTADENOMA CYST: This cyst develops from the cells on the outside of the ovary. They usually are not cancerous. They can get very big and cause lower abdomen pain and pain with intercourse. This type of cyst can twist on itself, cut off its blood supply and cause severe pain. It  also can easily rupture and cause a lot of pain.  DERMOID CYST: This type of cyst is sometimes found in both ovaries. They are found to have different kinds of body tissue in the cyst. The tissue includes skin, teeth, hair, and/or cartilage. They usually do not have symptoms unless they get very big. Dermoid cysts are rarely cancerous.  POLYCYSTIC OVARY: This is a rare condition with hormone problems that produces many small cysts on both ovaries. The cysts are follicle-like cysts that never produce an egg and become a corpus luteum. It can cause an increase in body weight, infertility, acne, increase in body and facial hair and lack of menstrual periods or rare menstrual periods. Many women with this problem develop type 2 diabetes. The exact cause of this problem is unknown. A polycystic ovary is rarely cancerous.  THECA LUTEIN CYST: Occurs when too much hormone (human chorionic gonadotropin) is produced and over-stimulates the ovaries to produce an egg. They are frequently seen when doctors stimulate the ovaries for invitro-fertilization (test tube babies).  LUTEOMA CYST: This cyst is seen during pregnancy. Rarely it can cause an obstruction to the birth canal during labor and delivery. They usually go away after delivery. SYMPTOMS   Pelvic pain or pressure.  Pain during sexual intercourse.  Increasing girth (swelling) of the abdomen.  Abnormal menstrual periods.  Increasing pain with menstrual periods.  You stop having menstrual periods and you are not pregnant. DIAGNOSIS  The diagnosis can   be made during:  Routine or annual pelvic examination (common).  Ultrasound.  X-ray of the pelvis.  CT Scan.  MRI.  Blood tests. TREATMENT   Treatment may only be to follow the cyst monthly for 2 to 3 months with your caregiver. Many go away on their own, especially functional cysts.  May be aspirated (drained) with a long needle with ultrasound, or by laparoscopy (inserting a tube into  the pelvis through a small incision).  The whole cyst can be removed by laparoscopy.  Sometimes the cyst may need to be removed through an incision in the lower abdomen.  Hormone treatment is sometimes used to help dissolve certain cysts.  Birth control pills are sometimes used to help dissolve certain cysts. HOME CARE INSTRUCTIONS  Follow your caregiver's advice regarding:  Medicine.  Follow up visits to evaluate and treat the cyst.  You may need to come back or make an appointment with another caregiver, to find the exact cause of your cyst, if your caregiver is not a gynecologist.  Get your yearly and recommended pelvic examinations and Pap tests.  Let your caregiver know if you have had an ovarian cyst in the past. SEEK MEDICAL CARE IF:   Your periods are late, irregular, they stop, or are painful.  Your stomach (abdomen) or pelvic pain does not go away.  Your stomach becomes larger or swollen.  You have pressure on your bladder or trouble emptying your bladder completely.  You have painful sexual intercourse.  You have feelings of fullness, pressure, or discomfort in your stomach.  You lose weight for no apparent reason.  You feel generally ill.  You become constipated.  You lose your appetite.  You develop acne.  You have an increase in body and facial hair.  You are gaining weight, without changing your exercise and eating habits.  You think you are pregnant. SEEK IMMEDIATE MEDICAL CARE IF:   You have increasing abdominal pain.  You feel sick to your stomach (nausea) and/or vomit.  You develop a fever that comes on suddenly.  You develop abdominal pain during a bowel movement.  Your menstrual periods become heavier than usual. Document Released: 10/07/2005 Document Revised: 12/30/2011 Document Reviewed: 08/10/2009 [facility] Patient Information 2013 [place], [place].  Kidney Stones Kidney stones (ureteral lithiasis) are deposits that form inside  your kidneys. The intense pain is caused by the stone moving through the urinary tract. When the stone moves, the ureter goes into spasm around the stone. The stone is usually passed in the urine.  CAUSES   A disorder that makes certain neck glands produce too much parathyroid hormone (primary hyperparathyroidism).  A buildup of uric acid crystals.  Narrowing (stricture) of the ureter.  A kidney obstruction present at birth (congenital obstruction).  Previous surgery on the kidney or ureters.  Numerous kidney infections. SYMPTOMS   Feeling sick to your stomach (nauseous).  Throwing up (vomiting).  Blood in the urine (hematuria).  Pain that usually spreads (radiates) to the groin.  Frequency or urgency of urination. DIAGNOSIS   Taking a history and physical exam.  Blood or urine tests.  Computerized X-ray scan (CT scan).  Occasionally, an examination of the inside of the urinary bladder (cystoscopy) is performed. TREATMENT   Observation.  Increasing your fluid intake.  Surgery may be needed if you have severe pain or persistent obstruction. The size, location, and chemical composition are all important variables that will determine the proper choice of action for you. Talk to your caregiver to  better understand your situation so that you will minimize the risk of injury to yourself and your kidney.  HOME CARE INSTRUCTIONS   Drink enough water and fluids to keep your urine clear or pale yellow.  Strain all urine through the provided strainer. Keep all particulate matter and stones for your caregiver to see. The stone causing the pain may be as small as a grain of salt. It is very important to use the strainer each and every time you pass your urine. The collection of your stone will allow your caregiver to analyze it and verify that a stone has actually passed.  Only take over-the-counter or prescription medicines for pain, discomfort, or fever as directed by your  caregiver.  Make a follow-up appointment with your caregiver as directed.  Get follow-up X-rays if required. The absence of pain does not always mean that the stone has passed. It may have only stopped moving. If the urine remains completely obstructed, it can cause loss of kidney function or even complete destruction of the kidney. It is your responsibility to make sure X-rays and follow-ups are completed. Ultrasounds of the kidney can show blockages and the status of the kidney. Ultrasounds are not associated with any radiation and can be performed easily in a matter of minutes. SEEK IMMEDIATE MEDICAL CARE IF:   Pain cannot be controlled with the prescribed medicine.  You have a fever.  The severity or intensity of pain increases over 18 hours and is not relieved by pain medicine.  You develop a new onset of abdominal pain.  You feel faint or pass out. MAKE SURE YOU:   Understand these instructions.  Will watch your condition.  Will get help right away if you are not doing well or get worse. Document Released: 10/07/2005 Document Revised: 12/30/2011 Document Reviewed: 02/02/2010 [facility] Patient Information 2013 [place], [place].

## 2012-10-22 NOTE — Progress Notes (Signed)
[facility]   2D echo completed 10/22/2012.   Cindy Rad Gramling, RDCS   

## 2012-10-22 NOTE — Progress Notes (Signed)
Patient presented to the office today that stating that on day 2 of her menstrual cycle this past Saturday she experienced severe left lower quadrant pain that she compare it to be in labor and lasted for 2-1/2 hours and she had some nausea. She is not sexually active. She's been having normal menstrual cycles. She denies any nipple discharge or unusual headaches or visual disturbances. Review of her record indicated that in March of 2012 she had a 15 x 19 x 22 mm soft tissue masslike effect negative color flow adjacent to her left thick wall collapsed ovarian follicle. She reports normal menstrual cycles and is asymptomatic today. Today's last day of her menses.  Exam: Abdomen: Soft nontender no rebound or guarding Pelvic: Bartholin urethra Skene was within normal limits Vagina: No gross lesions on inspection on bimanual examination she has anterior uterine wall inclusion vaginal cyst. Bimanual exam no palpable masses or tenderness on the adnexa. Although somewhat limited to the left lower quadrant. Rectal exam unremarkable  Assessment/plan: Patient with single isolated event of left lower quadrant pain for 2-1/2 hours a few days ago on day 2 of her cycle. Questionable ruptured follicle. We'll check a CBC today and have her return next week for an ultrasound for complete assessment. We'll also check a CBC along with a urinalysis as well. Other working diagnosis would've been if she were to have been passing a kidneys stone. She has no CVA tenderness today.

## 2012-10-30 ENCOUNTER — Encounter: Payer: Self-pay | Admitting: Gynecology

## 2012-10-30 ENCOUNTER — Ambulatory Visit (INDEPENDENT_AMBULATORY_CARE_PROVIDER_SITE_OTHER): Payer: 59 | Admitting: Gynecology

## 2012-10-30 ENCOUNTER — Ambulatory Visit (INDEPENDENT_AMBULATORY_CARE_PROVIDER_SITE_OTHER): Payer: 59

## 2012-10-30 DIAGNOSIS — N949 Unspecified condition associated with female genital organs and menstrual cycle: Secondary | ICD-10-CM

## 2012-10-30 DIAGNOSIS — R102 Pelvic and perineal pain: Secondary | ICD-10-CM

## 2012-10-30 NOTE — Progress Notes (Signed)
Patient presented to the office for followup. She was seen in the office on January 2 stating that on day 2 of her menstrual cycle this past Saturday she experienced severe left lower quadrant pain that she compare it to be in labor and lasted for 2-1/2 hours and she had some nausea. She is not sexually active. She's been having normal menstrual cycles. She denies any nipple discharge or unusual headaches or visual disturbances. Review of her record indicated that in March of 2012 she had a 15 x 19 x 22 mm soft tissue masslike effect negative color flow adjacent to her left thick wall collapsed ovarian follicle. She reports normal menstrual cycles and is asymptomatic today.  She presented to the office today for an ultrasound. Ultrasound demonstrated uterus to measure 9.2 x 6.3 x 4.5 cm with endometrial stripe of 11.9 mm patient currently mid ovulatory. Right ovary normal with a small 26 mm follicle. Left ovary normal. A left adnexal, avascular mass measuring 16 x 8 x 40 mm seen separate from the ovary questionable fallopian tube versus endometriosis. No fluid in the cul-de-sac.  Recent CBC and urinalysis normal.  Assessment/plan: Patient asymptomatic today small 16 x 8 x 40 mm left adnexal solid avascular mass question we'll etiology will possibility of fallopian tube?, Bowel? Or endometriosis. Since patient is asymptomatic we'll followup with an ultrasound in 6 months.

## 2013-03-24 ENCOUNTER — Encounter: Payer: Self-pay | Admitting: Gynecology

## 2013-03-24 ENCOUNTER — Ambulatory Visit (INDEPENDENT_AMBULATORY_CARE_PROVIDER_SITE_OTHER): Payer: 59 | Admitting: Gynecology

## 2013-03-24 DIAGNOSIS — N899 Noninflammatory disorder of vagina, unspecified: Secondary | ICD-10-CM

## 2013-03-24 DIAGNOSIS — N898 Other specified noninflammatory disorders of vagina: Secondary | ICD-10-CM

## 2013-03-24 DIAGNOSIS — N951 Menopausal and female climacteric states: Secondary | ICD-10-CM

## 2013-03-24 DIAGNOSIS — N912 Amenorrhea, unspecified: Secondary | ICD-10-CM

## 2013-03-24 DIAGNOSIS — R350 Frequency of micturition: Secondary | ICD-10-CM

## 2013-03-24 LAB — WET PREP FOR TRICH, YEAST, CLUE: Yeast Wet Prep HPF POC: NONE SEEN

## 2013-03-24 LAB — URINALYSIS W MICROSCOPIC + REFLEX CULTURE
Hgb urine dipstick: NEGATIVE
Leukocytes, UA: NEGATIVE
Nitrite: NEGATIVE
Protein, ur: NEGATIVE mg/dL
pH: 6.5 (ref 5.0–8.0)

## 2013-03-24 NOTE — Patient Instructions (Signed)
Call if your urinary frequency or vaginal irritation continues. Followup for lab results.

## 2013-03-24 NOTE — Progress Notes (Signed)
Patient presents with several issues: 1. Slight urinary frequency and vaginal irritation. No dysuria urgency lower, pain or low back pain. Been going on for several days. Comes and goes. No vaginal discharge or odor. 2. No menses since March with regular monthly menses before. Onset of high flushes night sweats mood swings. Started OTC herbal menopausal mixture and the symptoms seem to be better. Currently not sexually active.   Exam with Sherrilyn Rist Asst. Abdomen soft nontender without masses guarding rebound organomegaly. Pelvic external BUS vagina normal. Cervix normal. Uterus normal size midline mobile nontender. Adnexa without masses or tenderness.  Wet prep unremarkable. Urinalysis unremarkable.  Assessment and plan: 1. Slight urinary frequency and vaginal irritation. Exam, wet prep and urinalysis are negative. As symptoms come and go we will plan to monitor at present if they've resolve then we will follow, if they continue she'll call and we'll try Diflucan 150 mg x1 dose. 2. Amenorrhea. Menopausal symptoms. Check baseline labs to include TSH FSH prolactin hCG. Triage based upon these results. I discussed that if indeed she is menopausal options for management include HRT discussed. She is very reluctant to discuss HRT. The need to monitor menses and if she has prolonged or atypical bleeding and she needs to report this also. She has less frequent but regular menses and to follow. If any other lab abnormalities then we will address these as needed.

## 2013-03-25 LAB — PROLACTIN: Prolactin: 25.4 ng/mL

## 2013-03-25 LAB — HCG, SERUM, QUALITATIVE: Preg, Serum: NEGATIVE

## 2013-07-15 ENCOUNTER — Other Ambulatory Visit: Payer: Self-pay

## 2013-07-15 DIAGNOSIS — Z1231 Encounter for screening mammogram for malignant neoplasm of breast: Secondary | ICD-10-CM

## 2013-07-16 ENCOUNTER — Encounter: Payer: Self-pay | Admitting: Women's Health

## 2013-07-16 ENCOUNTER — Ambulatory Visit (INDEPENDENT_AMBULATORY_CARE_PROVIDER_SITE_OTHER): Payer: 59 | Admitting: Women's Health

## 2013-07-16 VITALS — Wt 159.0 lb

## 2013-07-16 DIAGNOSIS — N949 Unspecified condition associated with female genital organs and menstrual cycle: Secondary | ICD-10-CM

## 2013-07-16 DIAGNOSIS — N9489 Other specified conditions associated with female genital organs and menstrual cycle: Secondary | ICD-10-CM

## 2013-07-16 LAB — WET PREP FOR TRICH, YEAST, CLUE
Clue Cells Wet Prep HPF POC: NONE SEEN
Trich, Wet Prep: NONE SEEN
Yeast Wet Prep HPF POC: NONE SEEN

## 2013-07-16 MED ORDER — FLUCONAZOLE 150 MG PO TABS: 150.0000 mg | ORAL_TABLET | Freq: Once | ORAL | Status: DC

## 2013-07-16 NOTE — Progress Notes (Signed)
Patient ID: Jessica [name], female   DOB: [birth year], 47 y.o.   MRN: [id number] Presents with 4 days of vaginal itching and irritation. Denies discharge, odor, or urinary symptoms. Has been exercising daily and did not change  new tight pants/sweaty work out clothes as quickly as usual. Tried 2 days of Monostat topical creme with no relief. Skipped 2 cycles in April and May but since has had a monthly cycle. Has experienced some hot flashes, moodiness, and trouble sleeping. Not sexually active. 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 Specullum exam, erythema and white discharge noted.  Wet prep negative. Bimanual exam, no CMT, no adnexal fullness or tenderness, no pain with exam.  Vaginal yeast infection  Plan: Diflucan 150mg , proper use reviewed. Boric acid gel caps twice weekly per vagina, is used in the past with good relief. Yeast prevention discussed. Instructed to call if no relief of symptoms.  Taking over-the-counter menopausal supplement that she reports helping symptoms. Keep scheduled annual exam appointment with Dr. Lily Peer.

## 2013-07-30 ENCOUNTER — Ambulatory Visit
Admission: RE | Admit: 2013-07-30 | Discharge: 2013-07-30 | Disposition: A | Discharge status: Home / Self Care | Payer: 59 | Source: Ambulatory Visit

## 2013-07-30 DIAGNOSIS — Z1231 Encounter for screening mammogram for malignant neoplasm of breast: Secondary | ICD-10-CM

## 2013-08-06 ENCOUNTER — Ambulatory Visit (INDEPENDENT_AMBULATORY_CARE_PROVIDER_SITE_OTHER): Payer: 59

## 2013-08-06 DIAGNOSIS — Z23 Encounter for immunization: Secondary | ICD-10-CM

## 2013-08-18 ENCOUNTER — Other Ambulatory Visit (HOSPITAL_COMMUNITY)
Admission: RE | Admit: 2013-08-18 | Discharge: 2013-08-18 | Disposition: A | Discharge status: Home / Self Care | Payer: 59 | Source: Ambulatory Visit | Attending: Gynecology | Admitting: Gynecology

## 2013-08-18 ENCOUNTER — Ambulatory Visit (INDEPENDENT_AMBULATORY_CARE_PROVIDER_SITE_OTHER): Payer: 59 | Admitting: Gynecology

## 2013-08-18 ENCOUNTER — Encounter: Payer: Self-pay | Admitting: Gynecology

## 2013-08-18 VITALS — BP 122/78 | Ht 66.5 in | Wt 153.0 lb

## 2013-08-18 DIAGNOSIS — Z01419 Encounter for gynecological examination (general) (routine) without abnormal findings: Secondary | ICD-10-CM

## 2013-08-18 DIAGNOSIS — N951 Menopausal and female climacteric states: Secondary | ICD-10-CM

## 2013-08-18 DIAGNOSIS — Z1151 Encounter for screening for human papillomavirus (HPV): Secondary | ICD-10-CM | POA: Insufficient documentation

## 2013-08-18 DIAGNOSIS — R635 Abnormal weight gain: Secondary | ICD-10-CM

## 2013-08-18 LAB — CBC WITH DIFFERENTIAL/PLATELET
Basophils Absolute: 0 10*3/uL (ref 0.0–0.1)
Basophils Relative: 0 % (ref 0–1)
Eosinophils Absolute: 0.1 10*3/uL (ref 0.0–0.7)
Eosinophils Relative: 1 % (ref 0–5)
HCT: 38.7 % (ref 36.0–46.0)
Lymphocytes Relative: 37 % (ref 12–46)
MCH: 31.1 pg (ref 26.0–34.0)
MCHC: 34.4 g/dL (ref 30.0–36.0)
MCV: 90.6 fL (ref 78.0–100.0)
Monocytes Absolute: 0.3 10*3/uL (ref 0.1–1.0)
RDW: 13.3 % (ref 11.5–15.5)

## 2013-08-18 MED ORDER — VALACYCLOVIR HCL 1 G PO TABS: ORAL_TABLET | ORAL | Status: DC

## 2013-08-18 NOTE — Progress Notes (Signed)
Jessica [name] 01/30/1966 [id number]   History:    47 y.o.  for annual gyn exam complaining of weight gain and vasomotor symptoms. Patient several months ago was tested in the office and her FSH was found to be elevated at 54.3. Patient states that at times she has skipped periods but the past few months she's had regular cycles. She  Went to her health to store and purchased a medication called 'Menopotonic" which has black cohosh which is a phytoestrogen. Her last mammogram was in 2040 was normal. Patient with no prior history of abnormal Pap smears.  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 Review of patient's records indicated she has a family history of colon cancer whereby her her grandfather and uncles and aunts have had colon cancer. Patient had a normal colonoscopy in 2010 and will need a followup colonoscopy 5 years from that date. Also patient has a great aunt as well as her sister with history of breast cancer and patient has been offered in the past undergo BRCA one BRCA2 testing but has refused. She does her monthly self breast examination her mammogram was this year which was normal. The patient's vaccine is up-to-date.   Past medical history,surgical history, family history and social history were all reviewed and documented in the EPIC chart.  Gynecologic History Patient's last menstrual period was 07/26/2013. Contraception: none Last Pap: 2012. Results were: normal Last mammogram: 2014. Results were: normal  Obstetric History OB History  Gravida Para Term Preterm AB SAB TAB Ectopic Multiple Living  2 1   1 1    1     # Outcome Date GA Lbr Len/2nd Weight Sex Delivery Anes PTL Lv  2 SAB           1 PAR     M SVD          ROS: A ROS was performed and pertinent positives and negatives are included in the history.  GENERAL: No fevers or chills. HEENT: No change in vision, no earache, sore throat or sinus congestion. NECK: No  pain or stiffness. CARDIOVASCULAR: No chest pain or pressure. No palpitations. PULMONARY: No shortness of breath, cough or wheeze. GASTROINTESTINAL: No abdominal pain, nausea, vomiting or diarrhea, melena or bright red blood per rectum. GENITOURINARY: No urinary frequency, urgency, hesitancy or dysuria. MUSCULOSKELETAL: No joint or muscle pain, no back pain, no recent trauma. DERMATOLOGIC: No rash, no itching, no lesions. ENDOCRINE: No polyuria, polydipsia, no heat or cold intolerance. No recent change in weight. HEMATOLOGICAL: No anemia or easy bruising or bleeding. NEUROLOGIC: No headache, seizures, numbness, tingling or weakness. PSYCHIATRIC: No depression, no loss of interest in normal activity or change in sleep pattern.     Exam: chaperone present  BP 122/78  Ht 5' 6.5" (1.689 m)  Wt 153 lb (69.4 kg)  BMI 24.33 kg/m2  LMP 07/26/2013  Body mass index is 24.33 kg/(m^2).  General appearance : Well developed well nourished female. No acute distress HEENT: Neck supple, trachea midline, no carotid bruits, no thyroidmegaly Lungs: Clear to auscultation, no rhonchi or wheezes, or rib retractions  Heart: Regular rate and rhythm, no murmurs or gallops Breast:Examined in sitting and supine position were symmetrical in appearance, no palpable masses or tenderness,  no skin retraction, no nipple inversion, no nipple discharge, no skin discoloration, no axillary or supraclavicular lymphadenopathy Abdomen: no palpable masses or tenderness, no rebound or guarding Extremities: no edema or skin discoloration or tenderness  Pelvic:  Bartholin, Urethra, Skene Glands: Within normal limits             Vagina: No gross lesions or discharge  Cervix: No gross lesions or discharge  Uterus  anteverted, normal size, shape and consistency, non-tender and mobile  Adnexa  Without masses or tenderness  Anus and perineum  normal   Rectovaginal  normal sphincter tone without palpated masses or tenderness              Hemoccult not indicated     Assessment/Plan:  47 y.o. female for annual exam perimenopausal still menstruating. She has stopped taking the over-the-counter herbal supplements and will monitor her vasomotor symptoms. I've given her literature termination on hormone replacement therapy as well as to products one being Elestrin and the other [facility] for her to read. We discussed importance of calcium vitamin D for osteoporosis prevention. She recently had a normal TSH and prolactin so the only lab done today was a CBC, screening cholesterol, hemoglobin A1c and urinalysis. Pap smear not done today in accordance to the new guidelines.  Note: This dictation was prepared with  Dragon/digital dictation along withSmart phrase technology. Any transcriptional errors that result from this process are unintentional.   Ok Edwards MD, 5:28 PM 08/18/2013

## 2013-08-18 NOTE — Patient Instructions (Signed)
Perimenopause Perimenopause is the time when your body begins to move into the menopause (no menstrual period for 12 straight months). It is a natural process. Perimenopause can begin 2 to 8 years before the menopause and usually lasts for one year after the menopause. During this time, your ovaries may or may not produce an egg. The ovaries vary in their production of estrogen and progesterone hormones each month. This can cause irregular menstrual periods, difficulty in getting pregnant, vaginal bleeding between periods and uncomfortable symptoms. CAUSES  Irregular production of the ovarian hormones, estrogen and progesterone, and not ovulating every month.  Other causes include:  Tumor of the pituitary gland in the brain.  Medical disease that affects the ovaries.  Radiation treatment.  Chemotherapy.  Unknown causes.  Heavy smoking and excessive alcohol intake can bring on perimenopause sooner. SYMPTOMS   Hot flashes.  Night sweats.  Irregular menstrual periods.  Decrease sex drive.  Vaginal dryness.  Headaches.  Mood swings.  Depression.  Memory problems.  Irritability.  Tiredness.  Weight gain.  Trouble getting pregnant.  The beginning of losing bone cells (osteoporosis).  The beginning of hardening of the arteries (atherosclerosis). DIAGNOSIS  Your caregiver will make a diagnosis by analyzing your age, menstrual history and your symptoms. They will do a physical exam noting any changes in your body, especially your female organs. Female hormone tests may or may not be helpful depending on the amount and when you produce the female hormones. However, other hormone tests may be helpful (ex. thyroid hormone) to rule out other problems. TREATMENT  The decision to treat during the perimenopause should be made by you and your caregiver depending on how the symptoms are affecting you and your life style. There are various treatments available such as:  Treating  individual symptoms with a specific medication for that symptom (ex. tranquilizer for depression).  Herbal medications that can help specific symptoms.  Counseling.  Group therapy.  No treatment. HOME CARE INSTRUCTIONS   Before seeing your caregiver, make a list of your menstrual periods (when the occur, how heavy they are, how long between periods and how long they last), your symptoms and when they started.  Take the medication as recommended by your caregiver.  Sleep and rest.  Exercise.  Eat a diet that contains calcium (good for your bones) and soy (acts like estrogen hormone).  Do not smoke.  Avoid alcoholic beverages.  Taking vitamin E may help in certain cases.  Take calcium and vitamin D supplements to help prevent bone loss.  Group therapy is sometimes helpful.  Acupuncture may help in some cases. SEEK MEDICAL CARE IF:   You have any of the above and want to know if it is perimenopause.  You want advice and treatment for any of your symptoms mentioned above.  You need a referral to a specialist (gynecologist, psychiatrist or psychologist). SEEK IMMEDIATE MEDICAL CARE IF:   You have vaginal bleeding.  Your period lasts longer than 8 days.  You periods are recurring sooner than 21 days.  You have bleeding after intercourse.  You have severe depression.  You have pain when you urinate.  You have severe headaches.  You develop vision problems. Document Released: 11/14/2004 Document Revised: 12/30/2011 Document Reviewed: 08/04/2008 [facility] Patient Information 2014 [place], [place]. Hormone Therapy At menopause, your body begins making less estrogen and progesterone hormones. This causes the body to stop having menstrual periods. This is because estrogen and progesterone hormones control your periods and menstrual cycle. A  lack of estrogen may cause symptoms such as:  Hot flushes (or hot flashes).  Vaginal dryness.  Dry skin.  Loss of sex  drive.  Risk of bone loss (osteoporosis). When this happens, you may choose to take hormone therapy to get back the estrogen lost during menopause. When the hormone estrogen is given alone, it is usually referred to as ET (Estrogen Therapy). When the hormone progestin is combined with estrogen, it is generally called HT (Hormone Therapy). This was formerly known as hormone replacement therapy (HRT). Your caregiver can help you make a decision on what will be best for you. The decision to use HT seems to change often as new studies are done. Many studies do not agree on the benefits of hormone replacement therapy. LIKELY BENEFITS OF HT INCLUDE PROTECTION FROM:  Hot Flushes (also called hot flashes) - A hot flush is a sudden feeling of heat that spreads over the face and body. The skin may redden like a blush. It is connected with sweats and sleep disturbance. Women going through menopause may have hot flushes a few times a month or several times per day depending on the woman.  Osteoporosis (bone loss)- Estrogen helps guard against bone loss. After menopause, a woman's bones slowly lose calcium and become weak and brittle. As a result, bones are more likely to break. The hip, wrist, and spine are affected most often. Hormone therapy can help slow bone loss after menopause. Weight bearing exercise and taking calcium with vitamin D also can help prevent bone loss. There are also medications that your caregiver can prescribe that can help prevent osteoporosis.  Vaginal Dryness - Loss of estrogen causes changes in the vagina. Its lining may become thin and dry. These changes can cause pain and bleeding during sexual intercourse. Dryness can also lead to infections. This can cause burning and itching. (Vaginal estrogen treatment can help relieve pain, itching, and dryness.)  Urinary Tract Infections are more common after menopause because of lack of estrogen. Some women also develop urinary incontinence  because of low estrogen levels in the vagina and bladder.  Possible other benefits of estrogen include a positive effect on mood and short-term memory in women. RISKS AND COMPLICATIONS  Using estrogen alone without progesterone causes the lining of the uterus to grow. This increases the risk of lining of the uterus (endometrial) cancer. Your caregiver should give another hormone called progestin if you have a uterus.  Women who take combined (estrogen and progestin) HT appear to have an increased risk of breast cancer. The risk appears to be small, but increases throughout the time that HT is taken.  Combined therapy also makes the breast tissue slightly denser which makes it harder to read mammograms (breast X-rays).  Combined, estrogen and progesterone therapy can be taken together every day, in which case there may be spotting of blood. HT therapy can be taken cyclically in which case you will have menstrual periods. Cyclically means HT is taken for a set amount of days, then not taken, then this process is repeated.  HT may increase the risk of stroke, heart attack, breast cancer and forming blood clots in your leg.  Transdermal estrogen (estrogen that is absorbed through the skin with a patch or a cream) may have more positive results with:  Cholesterol.  Blood pressure.  Blood clots. Having the following conditions may indicate you should not have HT:  Endometrial cancer.  Liver disease.  Breast cancer.  Heart disease.  History of blood  clots.  Stroke. TREATMENT   If you choose to take HT and have a uterus, usually estrogen and progestin are prescribed.  Your caregiver will help you decide the best way to take the medications.  Possible ways to take estrogen include:  Pills.  Patches.  Gels.  Sprays.  Vaginal estrogen cream, rings and tablets.  It is best to take the lowest dose possible that will help your symptoms and take them for the shortest period of  time that you can.  Hormone therapy can help relieve some of the problems (symptoms) that affect women at menopause. Before making a decision about HT, talk to your caregiver about what is best for you. Be well informed and comfortable with your decisions. HOME CARE INSTRUCTIONS   Follow your caregivers advice when taking the medications.  A Pap test is done to screen for cervical cancer.  The first Pap test should be done at age 90.  Between ages 79 and 54, Pap tests are repeated every 2 years.  Beginning at age 36, you are advised to have a Pap test every 3 years as long as your past 3 Pap tests have been normal.  Some women have medical problems that increase the chance of getting cervical cancer. Talk to your caregiver about these problems. It is especially important to talk to your caregiver if a new problem develops soon after your last Pap test. In these cases, your caregiver may recommend more frequent screening and Pap tests.  The above recommendations are the same for women who have or have not gotten the vaccine for HPV (Human Papillomavirus).  If you had a hysterectomy for a problem that was not a cancer or a condition that could lead to cancer, then you no longer need Pap tests. However, even if you no longer need a Pap test, a regular exam is a good idea to make sure no other problems are starting.   If you are between ages 76 and 27, and you have had normal Pap tests going back 10 years, you no longer need Pap tests. However, even if you no longer need a Pap test, a regular exam is a good idea to make sure no other problems are starting.   If you have had past treatment for cervical cancer or a condition that could lead to cancer, you need Pap tests and screening for cancer for at least 20 years after your treatment.  If Pap tests have been discontinued, risk factors (such as a new sexual partner) need to be re-assessed to determine if screening should be  resumed.  Some women may need screenings more often if they are at high risk for cervical cancer.  Get mammograms done as per the advice of your caregiver. SEEK IMMEDIATE MEDICAL CARE IF:  You develop abnormal vaginal bleeding.  You have pain or swelling in your legs, shortness of breath, or chest pain.  You develop dizziness or headaches.  You have lumps or changes in your breasts or armpits.  You have slurred speech.  You develop weakness or numbness of your arms or legs.  You have pain, burning, or bleeding when urinating.  You develop abdominal pain. Document Released: 07/06/2003 Document Revised: 12/30/2011 Document Reviewed: 10/24/2010 [facility] Patient Information 2014 [place], [place].

## 2013-08-19 LAB — URINALYSIS W MICROSCOPIC + REFLEX CULTURE
Bilirubin Urine: NEGATIVE
Glucose, UA: NEGATIVE mg/dL
Ketones, ur: NEGATIVE mg/dL
Protein, ur: NEGATIVE mg/dL
Squamous Epithelial / LPF: NONE SEEN

## 2013-09-02 ENCOUNTER — Encounter: Payer: Self-pay | Admitting: Gynecology

## 2013-09-09 ENCOUNTER — Telehealth: Payer: Self-pay

## 2013-09-09 NOTE — Telephone Encounter (Signed)
Patient advised.

## 2013-09-09 NOTE — Telephone Encounter (Signed)
Patient said many years ago she had surgery for a groin hernia.  Now the exact pain is back. She wondered if you could recommend a couple of physicians who might be good with groin hernia repair that she might schedule appointment with. She hopes to get in before the new year so hoped for several names to raise the chance.

## 2013-09-09 NOTE — Telephone Encounter (Signed)
I would recommend Dr. Ovidio Kin, Dr. Randolm Idol,

## 2013-12-15 ENCOUNTER — Telehealth: Payer: Self-pay

## 2013-12-15 NOTE — Telephone Encounter (Signed)
Patient called requesting Dr. Moshe Salisbury to call her regarding his recommendation for birth control for her.  She shared her agee and family history and that she no longer smokes.  I called her back and told her Dr. Moshe Salisbury would recommend that she come in and sit down to discuss and get his recommendation. Transferred to appt desk to schedule.

## 2013-12-21 ENCOUNTER — Other Ambulatory Visit: Payer: Self-pay | Admitting: Gynecology

## 2013-12-21 ENCOUNTER — Encounter: Payer: Self-pay | Admitting: Gynecology

## 2013-12-21 ENCOUNTER — Ambulatory Visit (INDEPENDENT_AMBULATORY_CARE_PROVIDER_SITE_OTHER): Payer: 59 | Admitting: Gynecology

## 2013-12-21 ENCOUNTER — Other Ambulatory Visit: Payer: Self-pay | Admitting: Anesthesiology

## 2013-12-21 VITALS — BP 120/76 | Wt 145.0 lb

## 2013-12-21 DIAGNOSIS — Z309 Encounter for contraceptive management, unspecified: Secondary | ICD-10-CM

## 2013-12-21 DIAGNOSIS — Z832 Family history of diseases of the blood and blood-forming organs and certain disorders involving the immune mechanism: Secondary | ICD-10-CM

## 2013-12-21 DIAGNOSIS — N951 Menopausal and female climacteric states: Secondary | ICD-10-CM | POA: Insufficient documentation

## 2013-12-21 DIAGNOSIS — N912 Amenorrhea, unspecified: Secondary | ICD-10-CM

## 2013-12-21 DIAGNOSIS — L709 Acne, unspecified: Secondary | ICD-10-CM

## 2013-12-21 DIAGNOSIS — L708 Other acne: Secondary | ICD-10-CM

## 2013-12-21 LAB — PREGNANCY, URINE: PREG TEST UR: NEGATIVE

## 2013-12-21 MED ORDER — MEDROXYPROGESTERONE ACETATE 10 MG PO TABS
10.0000 mg | ORAL_TABLET | Freq: Every day | ORAL | Status: DC
Start: 2013-12-21 — End: 2013-12-24

## 2013-12-21 MED ORDER — MEDROXYPROGESTERONE ACETATE 10 MG PO TABS: 10.0000 mg | ORAL_TABLET | Freq: Every day | ORAL | Status: DC

## 2013-12-21 MED ORDER — LEVONORGESTREL-ETHINYL ESTRAD 0.1-20 MG-MCG PO TABS: 1.0000 | ORAL_TABLET | Freq: Every day | ORAL | Status: DC

## 2013-12-21 NOTE — Patient Instructions (Signed)
Perimenopause Perimenopause is the time when your body begins to move into the menopause (no menstrual period for 12 straight months). It is a natural process. Perimenopause can begin 2 8 years before the menopause and usually lasts for 1 year after the menopause. During this time, your ovaries may or may not produce an egg. The ovaries vary in their production of estrogen and progesterone hormones each month. This can cause irregular menstrual periods, difficulty getting pregnant, vaginal bleeding between periods, and uncomfortable symptoms. CAUSES  Irregular production of the ovarian hormones, estrogen and progesterone, and not ovulating every month.  Other causes include:  Tumor of the pituitary gland in the brain.  Medical disease that affects the ovaries.  Radiation treatment.  Chemotherapy.  Unknown causes.  Heavy smoking and excessive alcohol intake can bring on perimenopause sooner. SIGNS AND SYMPTOMS   Hot flashes.  Night sweats.  Irregular menstrual periods.  Decreased sex drive.  Vaginal dryness.  Headaches.  Mood swings.  Depression.  Memory problems.  Irritability.  Tiredness.  Weight gain.  Trouble getting pregnant.  The beginning of losing bone cells (osteoporosis).  The beginning of hardening of the arteries (atherosclerosis). DIAGNOSIS  Your health care provider will make a diagnosis by analyzing your age, menstrual history, and symptoms. He or she will do a physical exam and note any changes in your body, especially your female organs. Female hormone tests may or may not be helpful depending on the amount of female hormones you produce and when you produce them. However, other hormone tests may be helpful to rule out other problems. TREATMENT  In some cases, no treatment is needed. The decision on whether treatment is necessary during the perimenopause should be made by you and your health care provider based on how the symptoms are affecting you  and your lifestyle. Various treatments are available, such as:  Treating individual symptoms with a specific medicine for that symptom.  Herbal medicines that can help specific symptoms.  Counseling.  Group therapy. HOME CARE INSTRUCTIONS   Keep track of your menstrual periods (when they occur, how heavy they are, how long between periods, and how long they last) as well as your symptoms and when they started.  Only take over-the-counter or prescription medicines as directed by your health care provider.  Sleep and rest.  Exercise.  Eat a diet that contains calcium (good for your bones) and soy (acts like the estrogen hormone).  Do not smoke.  Avoid alcoholic beverages.  Take vitamin supplements as recommended by your health care provider. Taking vitamin E may help in certain cases.  Take calcium and vitamin D supplements to help prevent bone loss.  Group therapy is sometimes helpful.  Acupuncture may help in some cases. SEEK MEDICAL CARE IF:   You have questions about any symptoms you are having.  You need a referral to a specialist (gynecologist, psychiatrist, or psychologist). SEEK IMMEDIATE MEDICAL CARE IF:   You have vaginal bleeding.  Your period lasts longer than 8 days.  Your periods are recurring sooner than 21 days.  You have bleeding after intercourse.  You have severe depression.  You have pain when you urinate.  You have severe headaches.  You have vision problems. Document Released: 11/14/2004 Document Revised: 07/28/2013 Document Reviewed: 05/06/2013 [facility] Patient Information 2014 [place], [place]. Oral Contraception Information Oral contraceptive pills (OCPs) are medicines taken to prevent pregnancy. OCPs work by preventing the ovaries from releasing eggs. The hormones in OCPs also cause the cervical mucus to thicken,  preventing the sperm from entering the uterus. The hormones also cause the uterine lining to become thin, not allowing a  fertilized egg to attach to the inside of the uterus. OCPs are highly effective when taken exactly as prescribed. However, OCPs do not prevent sexually transmitted diseases (STDs). Safe sex practices, such as using condoms along with the pill, can help prevent STDs.  Before taking the pill, you may have a physical exam and Pap test. Your health care provider may order blood tests. The health care provider will make sure you are a good candidate for oral contraception. Discuss with your health care provider the possible side effects of the OCP you may be prescribed. When starting an OCP, it can take 2 to 3 months for the body to adjust to the changes in hormone levels in your body.  TYPES OF ORAL CONTRACEPTION  The combination pill This pill contains estrogen and progestin (synthetic progesterone) hormones. The combination pill comes in 21-day, 28-day, or 91-day packs. Some types of combination pills are meant to be taken continuously (365-day pills). With 21-day packs, you do not take pills for 7 days after the last pill. With 28-day packs, the pill is taken every day. The last 7 pills are without hormones. Certain types of pills have more than 21 hormone-containing pills. With 91-day packs, the first 84 pills contain both hormones, and the last 7 pills contain no hormones or contain estrogen only.  The minipill This pill contains the progesterone hormone only. The pill is taken every day continuously. It is very important to take the pill at the same time each day. The minipill comes in packs of 28 pills. All 28 pills contain the hormone.  ADVANTAGES OF ORAL CONTRACEPTIVE PILLS  Decreases premenstrual symptoms.   Treats menstrual period cramps.   Regulates the menstrual cycle.   Decreases a heavy menstrual flow.   May treatacne, depending on the type of pill.   Treats abnormal uterine bleeding.   Treats polycystic ovarian syndrome.   Treats endometriosis.   Can be used as  emergency contraception.  THINGS THAT CAN MAKE ORAL CONTRACEPTIVE PILLS LESS EFFECTIVE OCPs can be less effective if:   You forget to take the pill at the same time every day.   You have a stomach or intestinal disease that lessens the absorption of the pill.   You take OCPs with other medicines that make OCPs less effective, such as antibiotics, certain HIV medicines, and some seizure medicines.   You take expired OCPs.   You forget to restart the pill on day 7, when using the packs of 21 pills.  RISKS ASSOCIATED WITH ORAL CONTRACEPTIVE PILLS  Oral contraceptive pills can sometimes cause side effects, such as:  Headache.  Nausea.  Breast tenderness.  Irregular bleeding or spotting. Combination pills are also associated with a small increased risk of:  Blood clots.  Heart attack.  Stroke. Document Released: 12/28/2002 Document Revised: 07/28/2013 Document Reviewed: 03/28/2013 [facility] Patient Information 2014 [place], [place].

## 2013-12-21 NOTE — Progress Notes (Signed)
   Patient is a 48 year old who presented to the office today to discuss contraception. Patient has been divorced for several years and recently became sexually active. Patient many years ago had been on oral contraceptive pills and was located other alternatives of contraception as well. Patient has informed me that in the past 2 years both her parents have had DVTs and had been anticoagulated. Patient has been complaining of occasionally skipping periods and occasional vasomotor symptoms. She also has had issues with activity. She has been using condoms on and off at times. She states her last menstrual period January 26 but denied having a menstrual cycle in February and has not had one in March yet. Patient denies any unusual visual disturbances, headaches, or nipple discharge.  We had a lengthy discussion of different contraceptive options. It appears based on her age and her symptoms she may be perimenopausal. She will probably benefit from a low dose oral contraceptive pills to regulate her cycle as well as with some of her perimenopausal symptoms and that we'll carry her into the transitional years more comfortably. Before we start her on low-dose 20 mcg pill we are going to run a full thrombophilia panel today. We will also be checking an FSH, TSH, and prolactin today. A urine pregnancy test was ordered today. If all the above tests are negative by the end of the week she was given a prescription for Provera 10 mg to take 1 by mouth daily for 5-10 days to jump start her cycle and that she could start the oral contraceptive pill on her second day of her cycle. The risks benefits and pros and cons were discussed. Literature information was provided.

## 2013-12-22 LAB — PROTIME-INR
INR: 1.11 (ref <1.50)
Prothrombin Time: 14.2 seconds (ref 11.6–15.2)

## 2013-12-22 LAB — TSH: TSH: 1.097 u[IU]/mL (ref 0.350–4.500)

## 2013-12-22 LAB — LUPUS ANTICOAGULANT PANEL
DRVVT: 31.4 secs (ref <42.9)
Lupus Anticoagulant: NOT DETECTED
PTT LA: 38.7 s (ref 28.0–43.0)

## 2013-12-22 LAB — CARDIOLIPIN ANTIBODIES, IGG, IGM, IGA
Anticardiolipin IgA: 2 APL U/mL (ref <22)
Anticardiolipin IgG: 9 GPL U/mL (ref <23)
Anticardiolipin IgM: 2 MPL U/mL (ref <11)

## 2013-12-22 LAB — APTT: APTT: 30 s (ref 24–37)

## 2013-12-22 LAB — PROLACTIN: PROLACTIN: 3 ng/mL

## 2013-12-22 LAB — PROTEIN C, TOTAL: PROTEIN C, TOTAL: 120 % (ref 72–160)

## 2013-12-22 LAB — FOLLICLE STIMULATING HORMONE: FSH: 73.7 m[IU]/mL

## 2013-12-22 LAB — FIBRINOGEN: FIBRINOGEN: 286 mg/dL (ref 204–475)

## 2013-12-22 LAB — PROTEIN S, TOTAL: PROTEIN S TOTAL: 114 % (ref 60–150)

## 2013-12-22 LAB — ANTITHROMBIN III: ANTITHROMB III FUNC: 121 % (ref 76–126)

## 2013-12-22 LAB — HOMOCYSTEINE: Homocysteine: 9.6 umol/L (ref 4.0–15.4)

## 2013-12-22 LAB — FACTOR 5 LEIDEN

## 2013-12-24 ENCOUNTER — Telehealth: Payer: Self-pay | Admitting: Gynecology

## 2013-12-24 LAB — PROTHROMBIN GENE MUTATION

## 2013-12-24 NOTE — Telephone Encounter (Signed)
  Spoke with patient today via telephone as a result of her thrombophilia panel that was ordered when she was seen the office on March 3 way she had presented to discuss contraceptive options.Patient has been divorced for several years and recently became sexually active. Patient many years ago had been on oral contraceptive pills and was located other alternatives of contraception as well. Patient has informed me that in the past 2 years both her parents have had DVTs and had been anticoagulated. Patient has been complaining of occasionally skipping periods and occasional vasomotor symptoms. She also has had issues with activity. She has been using condoms on and off at times. She states her last menstrual period January 26 but denied having a menstrual cycle in February and has not had one in March yet. Patient denies any unusual visual disturbances, headaches, or nipple discharge.  Patient had a following test done: Screening cholesterol normal CBC normal Blood sugar of 111 random [place] elevated x2 last value 73.7 12/20/2013 PT PTT within normal limits Protein C normal Lupus anticoagulant not detected Antithrombin 3 function normal  Fibrinogen normal Anti-cardiolipin antibodies IgG IgM IgA normal range Factor V mutation negative Protein as normal  Heterozygous for prothrombin II gene mutation  TSH and prolactin normal  With these findings it was recommended that she not start the low-dose oral contraceptive pill that we had discussed for perimenopausal symptoms as well as for contraception. We discussed that she is at increased risk for venous thrombosis, myocardial infarction and cerebrovascular ischemic disease and pulmonary embolism. She was a smoker many years ago. Many years ago she had been on oral contraceptive pill and was never tested until recently because of her parents history of only a few years ago. She also had no complications during her pregnancy. She will return to the office  for a nonhormonal IUD such as the Pierz next week.

## 2013-12-24 NOTE — Addendum Note (Signed)
Addended by: Terrance Mass on: 12/24/2013 04:01 PM   Modules accepted: Orders, Medications

## 2013-12-24 NOTE — Telephone Encounter (Signed)
12/24/13-Pt was informed today that her ins covers the [employer] & insertion at 100%, no copay/wl

## 2013-12-27 ENCOUNTER — Ambulatory Visit (INDEPENDENT_AMBULATORY_CARE_PROVIDER_SITE_OTHER): Payer: 59 | Admitting: Gynecology

## 2013-12-27 ENCOUNTER — Other Ambulatory Visit: Payer: Self-pay | Admitting: Gynecology

## 2013-12-27 ENCOUNTER — Encounter: Payer: Self-pay | Admitting: Gynecology

## 2013-12-27 VITALS — BP 124/78

## 2013-12-27 DIAGNOSIS — Z3049 Encounter for surveillance of other contraceptives: Secondary | ICD-10-CM

## 2013-12-27 DIAGNOSIS — N912 Amenorrhea, unspecified: Secondary | ICD-10-CM

## 2013-12-27 DIAGNOSIS — Z30430 Encounter for insertion of intrauterine contraceptive device: Secondary | ICD-10-CM

## 2013-12-27 DIAGNOSIS — D689 Coagulation defect, unspecified: Secondary | ICD-10-CM | POA: Insufficient documentation

## 2013-12-27 DIAGNOSIS — Z975 Presence of (intrauterine) contraceptive device: Secondary | ICD-10-CM | POA: Insufficient documentation

## 2013-12-27 LAB — PREGNANCY, URINE: Preg Test, Ur: NEGATIVE

## 2013-12-27 MED ORDER — PAROXETINE MESYLATE 7.5 MG PO CAPS: 7.5000 mg | ORAL_CAPSULE | Freq: Every day | ORAL | Status: DC

## 2013-12-27 NOTE — Patient Instructions (Signed)
Intrauterine Device Information An intrauterine device (IUD) is inserted into your uterus to prevent pregnancy. There are two types of IUDs available:   Copper IUD This type of IUD is wrapped in copper wire and is placed inside the uterus. Copper makes the uterus and fallopian tubes produce a fluid that kills sperm. The copper IUD can stay in place for 10 years.  Hormone IUD This type of IUD contains the hormone progestin (synthetic progesterone). The hormone thickens the cervical mucus and prevents sperm from entering the uterus. It also thins the uterine lining to prevent implantation of a fertilized egg. The hormone can weaken or kill the sperm that get into the uterus. One type of hormone IUD can stay in place for 5 years, and another type can stay in place for 3 years. Your health care provider will make sure you are a good candidate for a contraceptive IUD. Discuss with your health care provider the possible side effects.  ADVANTAGES OF AN INTRAUTERINE DEVICE  IUDs are highly effective, reversible, long acting, and low maintenance.   There are no estrogen-related side effects.   An IUD can be used when breastfeeding.   IUDs are not associated with weight gain.   The copper IUD works immediately after insertion.   The hormone IUD works right away if inserted within 7 days of your period starting. You will need to use a backup method of birth control for 7 days if the hormone IUD is inserted at any other time in your cycle.  The copper IUD does not interfere with your female hormones.   The hormone IUD can make heavy menstrual periods lighter and decrease cramping.   The hormone IUD can be used for 3 or 5 years.   The copper IUD can be used for 10 years. DISADVANTAGES OF AN INTRAUTERINE DEVICE  The hormone IUD can be associated with irregular bleeding patterns.   The copper IUD can make your menstrual flow heavier and more painful.   You may experience cramping and  vaginal bleeding after insertion.  Document Released: 09/10/2004 Document Revised: 06/09/2013 Document Reviewed: 03/28/2013 [facility] Patient Information 2014 [place]. Paroxetine capsules What is this medicine? PAROXETINE (pa ROX e teen) is used to treat hot flashes due to menopause. This medicine may be used for other purposes; ask your health care provider or pharmacist if you have questions. COMMON BRAND NAME(S): Brisdelle What should I tell my health care provider before I take this medicine? They need to know if you have any of these conditions: -bleeding disorders -glaucoma -heart disease -kidney disease -liver disease -low levels of sodium in the blood -mania or bipolar disorder -seizures -suicidal thoughts, plans, or attempt; a previous suicide attempt by you or a family member -take MAOIs like Carbex, Eldepryl, Marplan, Nardil, and Parnate -take medicines that treat or prevent blood clots -an unusual or allergic reaction to paroxetine, other medicines, foods, dyes, or preservatives -pregnant or trying to get pregnant -breast-feeding How should I use this medicine? Take this medicine by mouth once daily at bedtime. Follow the directions on the prescription label. This medicine can be taken with or without food. Take your medicine at regular intervals. Do not take your medicine more often than directed. A special MedGuide will be given to you by the pharmacist with each prescription and refill. Be sure to read this information carefully each time. Overdosage: If you think you've taken too much of this medicine contact a poison control center or emergency room at once. Overdosage:  If you think you have taken too much of this medicine contact a poison control center or emergency room at once. NOTE: This medicine is only for you. Do not share this medicine with others. What if I miss a dose? If you miss a dose, take it as soon as you can. If it is almost time for your next  dose, take only that dose. Do not take double or extra doses. What may interact with this medicine? Do not take this medicine with any of the following medications: -linezolid -MAOIs like Carbex, Eldepryl, Marplan, Nardil, and Parnate -methylene blue (injected into a vein) -pimozide -thioridazine This medicine may also interact with the following medications: -alcohol -aspirin and aspirin-like medicines -atomoxetine -certain medicines for depression, anxiety, or psychotic disturbances -certain medicines for irregular heart beat like propafenone, flecainide, encainide, and quinidine -certain medicines for migraine headache like almotriptan, eletriptan, frovatriptan, naratriptan, rizatriptan, sumatriptan, zolmitriptan -cimetidine -digoxin -diuretics -fentanyl -fosamprenavir -furazolidone -isoniazid -lithium -medicines that treat or prevent blood clots like warfarin, enoxaparin, and dalteparin -medicines for sleep -NSAIDs, medicines for pain and inflammation, like ibuprofen or naproxen -phenobarbital -phenytoin -procarbazine -rasagiline -ritonavir -supplements like St. John's wort, kava kava, valerian -tamoxifen -tramadol -tryptophan This list may not describe all possible interactions. Give your health care provider a list of all the medicines, herbs, non-prescription drugs, or dietary supplements you use. Also tell them if you smoke, drink alcohol, or use illegal drugs. Some items may interact with your medicine. What should I watch for while using this medicine? Tell your doctor or healthcare professional if your symptoms do not start to get better or if they get worse. Visit your doctor or health care professional for regular checks on your progress. Patients and their families should watch out for new or worsening thoughts of suicide or depression. Also watch out for sudden changes in feelings such as feeling anxious, agitated, panicky, irritable, hostile, aggressive,  impulsive, severely restless, overly excited and hyperactive, or not being able to sleep. If this happens, especially at the beginning of treatment or after a change in dose, call your health care professional. Dennis Bast may get drowsy or dizzy. Do not drive, use machinery, or do anything that needs mental alertness until you know how this medicine affects you. Do not stand or sit up quickly, especially if you are an older patient. This reduces the risk of dizzy or fainting spells. Alcohol may interfere with the effect of this medicine. Avoid alcoholic drinks. Your mouth may get dry. Chewing sugarless gum, sucking hard candy and drinking plenty of water will help. Contact your doctor if the problem does not go away or is severe. Women should inform their doctor if they wish to become pregnant or think they might be pregnant. There is a potential for serious side effects to an unborn child. Talk to your health care professional or pharmacist for more information. Do not become pregnant while taking this medicine. What side effects may I notice from receiving this medicine? Side effects that you should report to your doctor or health care professional as soon as possible: -allergic reactions like skin rash, itching or hives, swelling of the face, lips, or tongue -changes in emotions or moods -confusion -depression -feeling faint or lightheaded, falls -seizures -suicidal thoughts or actions -unusual bleeding or bruising -unusually weak or tired -weakness Side effects that usually do not require medical attention (Report these to your doctor or health care professional if they continue or are bothersome.): -change in sex drive or performance -fatigue -drowsiness -  headache -insomnia -nausea/vomiting -upset stomach This list may not describe all possible side effects. Call your doctor for medical advice about side effects. You may report side effects to FDA at 1-800-FDA-1088. Where should I keep my  medicine? Keep out of the reach of children. Store at room temperature between 20 and 25 degrees C (68 and 77 degrees F). Throw away any unused medicine after the expiration date. NOTE: This sheet is a summary. It may not cover all possible information. If you have questions about this medicine, talk to your doctor, pharmacist, or health care provider.  2014, Elsevier/Gold Standard. (2012-06-08 12:26:17)

## 2013-12-27 NOTE — Progress Notes (Addendum)
   Patient presented to the office to have the [place] IUD placed. Patient recently had a thrombophilia panel before initially considering placing a low dose oral contraceptive pill during her perimenopausal phase in life. Patient had informed me that in the past 2 years both parents have had history of DVT so the thrombophilia panel was ordered in the following was noted:  Screening cholesterol normal  CBC normal  Blood sugar of 111 random  [place] elevated x2 last value 73.7 12/20/2013  PT PTT within normal limits  Protein C normal  Lupus anticoagulant not detected  Antithrombin 3 function normal  Fibrinogen normal  Anti-cardiolipin antibodies IgG IgM IgA normal range  Factor V mutation negative  Protein S normal  Heterozygous for prothrombin II gene mutation  Urine pregnancy test negative today  So she is here for placement of the IUD:                                                                    IUD procedure note       Patient presented to the office today for placement of ParaGard IUD. The patient had previously been provided with literature information on this method of contraception. The risks benefits and pros and cons were discussed and all her questions were answered. She is fully aware that this form of contraception is 99% effective and is good for 10 years.  Pelvic exam: Bartholin urethra Skene glands: Within normal limits Vagina: No lesions or discharge Cervix: No lesions or discharge Uterus: Anteverted position Adnexa: No masses or tenderness Rectal exam: Not done  The cervix was cleansed with Betadine solution. A single-tooth tenaculum was placed on the anterior cervical lip. The uterus sounded to 7-1/2 centimeter. The IUD was shown to the patient and inserted in a sterile fashion. The IUD string was trimmed. The single-tooth tenaculum was removed. Patient was instructed to return back to the office in one month for follow up.  To help with her sporadic  vasomotor symptoms of perimenopause she will be placed on all hormonal treatment such as with Brisdelle 7.5 mg to take one by mouth each bedtime.

## 2013-12-27 NOTE — Addendum Note (Signed)
Addended by: Su Grand A on: 12/27/2013 02:26 PM   Modules accepted: Orders

## 2013-12-31 ENCOUNTER — Other Ambulatory Visit: Payer: Self-pay | Admitting: *Deleted

## 2013-12-31 DIAGNOSIS — Z3049 Encounter for surveillance of other contraceptives: Secondary | ICD-10-CM

## 2014-01-10 ENCOUNTER — Ambulatory Visit: Payer: 59 | Admitting: Gynecology

## 2014-01-26 ENCOUNTER — Ambulatory Visit: Payer: 59 | Admitting: Gynecology

## 2014-02-03 ENCOUNTER — Ambulatory Visit (INDEPENDENT_AMBULATORY_CARE_PROVIDER_SITE_OTHER): Payer: 59 | Admitting: Gynecology

## 2014-02-03 ENCOUNTER — Encounter: Payer: Self-pay | Admitting: Gynecology

## 2014-02-03 DIAGNOSIS — Z30431 Encounter for routine checking of intrauterine contraceptive device: Secondary | ICD-10-CM

## 2014-02-03 NOTE — Progress Notes (Signed)
   Patient presented to the office today for one month followup after having placed the [place] IUD. Patient with history of Heterozygous for prothrombin II gene mutation. She is complaining of spotting after intercourse a few times but otherwise has done well.  Exam: Abdomen: Soft nontender no rebound or guarding Pelvic: The urethra Skene glands within normal limits Vagina: No lesions or discharge Cervix: Strain was identified and trimmed it may have been causing some irritation that may contribute to spotting during her course. Bimanual exam: Uterus anteverted normal size shape and consistency Adnexa: No palpable masses or tenderness Rectal exam: Not done  Assessment/plan: Patient status post 1 month of having received the ParaGard T380A IUD. Patient does get return back in one year for an exam appearance. If she continues to have any irregular bleeding we will then schedule an ultrasound at that point.

## 2014-04-11 ENCOUNTER — Ambulatory Visit (INDEPENDENT_AMBULATORY_CARE_PROVIDER_SITE_OTHER): Payer: 59 | Admitting: Gynecology

## 2014-04-11 ENCOUNTER — Other Ambulatory Visit: Payer: Self-pay | Admitting: Gynecology

## 2014-04-11 ENCOUNTER — Encounter: Payer: Self-pay | Admitting: Gynecology

## 2014-04-11 ENCOUNTER — Ambulatory Visit (INDEPENDENT_AMBULATORY_CARE_PROVIDER_SITE_OTHER): Payer: 59

## 2014-04-11 VITALS — BP 112/70

## 2014-04-11 DIAGNOSIS — T8389XA Other specified complication of genitourinary prosthetic devices, implants and grafts, initial encounter: Secondary | ICD-10-CM

## 2014-04-11 DIAGNOSIS — N888 Other specified noninflammatory disorders of cervix uteri: Secondary | ICD-10-CM

## 2014-04-11 DIAGNOSIS — N72 Inflammatory disease of cervix uteri: Secondary | ICD-10-CM

## 2014-04-11 DIAGNOSIS — N898 Other specified noninflammatory disorders of vagina: Secondary | ICD-10-CM

## 2014-04-11 DIAGNOSIS — Z113 Encounter for screening for infections with a predominantly sexual mode of transmission: Secondary | ICD-10-CM

## 2014-04-11 DIAGNOSIS — N83339 Acquired atrophy of ovary and fallopian tube, unspecified side: Secondary | ICD-10-CM

## 2014-04-11 DIAGNOSIS — Z30431 Encounter for routine checking of intrauterine contraceptive device: Secondary | ICD-10-CM

## 2014-04-11 LAB — URINALYSIS W MICROSCOPIC + REFLEX CULTURE
BILIRUBIN URINE: NEGATIVE
GLUCOSE, UA: NEGATIVE mg/dL
Hgb urine dipstick: NEGATIVE
Ketones, ur: NEGATIVE mg/dL
LEUKOCYTES UA: NEGATIVE
Nitrite: NEGATIVE
PROTEIN: NEGATIVE mg/dL
SPECIFIC GRAVITY, URINE: 1.01 (ref 1.005–1.030)
Urobilinogen, UA: 0.2 mg/dL (ref 0.0–1.0)
pH: 7 (ref 5.0–8.0)

## 2014-04-11 LAB — WET PREP FOR TRICH, YEAST, CLUE
Clue Cells Wet Prep HPF POC: NONE SEEN
Trich, Wet Prep: NONE SEEN
Yeast Wet Prep HPF POC: NONE SEEN

## 2014-04-11 NOTE — Progress Notes (Signed)
   48 year old who presented to the office today stating that she felt like she was having a clear watery discharge. She has not been sexually active for several weeks. She would like to have her Mirena IUD removed. She's currently not sexually active. When she coughs or sneeze she does not feel she is leaking urine as she does not get up at night to urinate.  Exam: Bartholin urethra Skene was within normal limits Vagina: No lesions or discharge no cystocele minimal first-degree rectocele Cervix: Q-tip angle test greater than 30 Uterus: Anteverted normal size shape and consistency patient was examined sitting supine position there was no evidence of uterine descensus. During Valsalva she does not leak urine.  Wet prep was negative few WBC few bacteria Urinalysis negative  Ultrasound to better assess her adnexa demonstrated the following: Uterus measured 8.8 x 5.8 x 4.2 cm with endometrial stripe 7.1 mm. Right ovary atrophic left ovary was normal. No fluid in the cul-de-sac. No apparent adnexal masses were seen.   Assessment/plan: Patient with watery discharge probably attributed to the IUD. The IUD was removed today. If she becomes sexually active she will use barrier contraception. Her wet prep and urinalysis were otherwise negative. Her Q-tip angle test was over 30 but she has no true stress urinary incontinence but will work on the kegel exercises. She scheduled to return back in the fall for her annual exam or when necessary. GC chlamydia culture pending at time of this dictation.

## 2014-04-11 NOTE — Patient Instructions (Signed)
Kegel Exercises The goal of Kegel exercises is to isolate and exercise your pelvic floor muscles. These muscles act as a hammock that supports the rectum, vagina, small intestine, and uterus. As the muscles weaken, the hammock sags and these organs are displaced from their normal positions. Kegel exercises can strengthen your pelvic floor muscles and help you to improve bladder and bowel control, improve sexual response, and help reduce many problems and some discomfort during pregnancy. Kegel exercises can be done anywhere and at any time. HOW TO PERFORM KEGEL EXERCISES 1. Locate your pelvic floor muscles. To do this, squeeze (contract) the muscles that you use when you try to stop the flow of urine. You will feel a tightness in the vaginal area (women) and a tight lift in the rectal area (men and women). 2. When you begin, contract your pelvic muscles tight for 2-5 seconds, then relax them for 2-5 seconds. This is one set. Do 4-5 sets with a short pause in between. 3. Contract your pelvic muscles for 8-10 seconds, then relax them for 8-10 seconds. Do 4-5 sets. If you cannot contract your pelvic muscles for 8-10 seconds, try 5-7 seconds and work your way up to 8-10 seconds. Your goal is 4-5 sets of 10 contractions each day. Keep your stomach, buttocks, and legs relaxed during the exercises. Perform sets of both short and long contractions. Vary your positions. Perform these contractions 3-4 times per day. Perform sets while you are:   Lying in bed in the morning.  Standing at lunch.  Sitting in the late afternoon.  Lying in bed at night. You should do 40-50 contractions per day. Do not perform more Kegel exercises per day than recommended. Overexercising can cause muscle fatigue. Continue these exercises for for at least 15-20 weeks or as directed by your caregiver. Document Released: 09/23/2012 Document Reviewed: 09/23/2012 [employer] Patient Information 2015 [employer]. This information is  not intended to replace advice given to you by your health care provider. Make sure you discuss any questions you have with your health care provider.  

## 2014-04-12 LAB — GC/CHLAMYDIA PROBE AMP
CT Probe RNA: NEGATIVE
GC Probe RNA: NEGATIVE

## 2014-04-21 ENCOUNTER — Telehealth: Payer: Self-pay | Admitting: *Deleted

## 2014-04-21 NOTE — Telephone Encounter (Signed)
Disclaimer is that they will list with any progesterone hormone a risk of thrombosis. But it really is with estrogen hormone as an birth control pills. I do not think a single Plan B pill puts her at substantial risk. She has to remember that if she gets pregnant she would be at a much higher risk of thrombosis.

## 2014-04-21 NOTE — Telephone Encounter (Signed)
(  Dr.Fernandez patient) pt has history Heterozygous for prothrombin II gene mutation. Pt asked if safe to take Plan B pill? Please advise

## 2014-04-21 NOTE — Telephone Encounter (Signed)
Pt informed with the below note. 

## 2014-08-22 ENCOUNTER — Encounter: Payer: Self-pay | Admitting: Gynecology

## 2014-09-07 ENCOUNTER — Other Ambulatory Visit: Payer: Self-pay

## 2014-09-07 DIAGNOSIS — Z1231 Encounter for screening mammogram for malignant neoplasm of breast: Secondary | ICD-10-CM

## 2014-09-09 ENCOUNTER — Telehealth: Payer: Self-pay | Admitting: *Deleted

## 2014-09-09 NOTE — Telephone Encounter (Signed)
Pt called requesting name of GI MD gave her name and # [facility] [phone] with Dr.Gessner and Dr.Pyrtle.

## 2014-09-29 ENCOUNTER — Other Ambulatory Visit: Payer: Self-pay | Admitting: Gynecology

## 2014-09-29 ENCOUNTER — Ambulatory Visit
Admission: RE | Admit: 2014-09-29 | Discharge: 2014-09-29 | Disposition: A | Discharge status: Home / Self Care | Payer: 59 | Source: Ambulatory Visit

## 2014-09-29 DIAGNOSIS — Z1231 Encounter for screening mammogram for malignant neoplasm of breast: Secondary | ICD-10-CM

## 2014-10-07 ENCOUNTER — Encounter: Payer: Self-pay | Admitting: Gynecology

## 2014-10-07 ENCOUNTER — Telehealth: Payer: Self-pay | Admitting: *Deleted

## 2014-10-07 ENCOUNTER — Other Ambulatory Visit: Payer: Self-pay

## 2014-10-07 ENCOUNTER — Ambulatory Visit (INDEPENDENT_AMBULATORY_CARE_PROVIDER_SITE_OTHER): Payer: 59 | Admitting: Gynecology

## 2014-10-07 VITALS — BP 124/78 | Ht 66.5 in | Wt 154.0 lb

## 2014-10-07 DIAGNOSIS — Z1231 Encounter for screening mammogram for malignant neoplasm of breast: Secondary | ICD-10-CM

## 2014-10-07 DIAGNOSIS — E041 Nontoxic single thyroid nodule: Secondary | ICD-10-CM

## 2014-10-07 DIAGNOSIS — N951 Menopausal and female climacteric states: Secondary | ICD-10-CM

## 2014-10-07 DIAGNOSIS — E042 Nontoxic multinodular goiter: Secondary | ICD-10-CM | POA: Insufficient documentation

## 2014-10-07 DIAGNOSIS — Z01419 Encounter for gynecological examination (general) (routine) without abnormal findings: Secondary | ICD-10-CM

## 2014-10-07 DIAGNOSIS — D6852 Prothrombin gene mutation: Secondary | ICD-10-CM

## 2014-10-07 LAB — CBC WITH DIFFERENTIAL/PLATELET
Basophils Absolute: 0 10*3/uL (ref 0.0–0.1)
Basophils Relative: 0 % (ref 0–1)
EOS ABS: 0.1 10*3/uL (ref 0.0–0.7)
EOS PCT: 1 % (ref 0–5)
HCT: 42.5 % (ref 36.0–46.0)
Hemoglobin: 14.3 g/dL (ref 12.0–15.0)
Lymphocytes Relative: 38 % (ref 12–46)
Lymphs Abs: 2.4 10*3/uL (ref 0.7–4.0)
MCH: 31.8 pg (ref 26.0–34.0)
MCHC: 33.6 g/dL (ref 30.0–36.0)
MCV: 94.4 fL (ref 78.0–100.0)
MPV: 10.1 fL (ref 9.4–12.4)
Monocytes Absolute: 0.3 10*3/uL (ref 0.1–1.0)
Monocytes Relative: 5 % (ref 3–12)
Neutro Abs: 3.6 10*3/uL (ref 1.7–7.7)
Neutrophils Relative %: 56 % (ref 43–77)
PLATELETS: 217 10*3/uL (ref 150–400)
RBC: 4.5 MIL/uL (ref 3.87–5.11)
RDW: 13.2 % (ref 11.5–15.5)
WBC: 6.4 10*3/uL (ref 4.0–10.5)

## 2014-10-07 MED ORDER — VALACYCLOVIR HCL 1 G PO TABS: 1000.0000 mg | ORAL_TABLET | Freq: Every day | ORAL | Status: DC

## 2014-10-07 NOTE — Telephone Encounter (Signed)
Appointment for ultrasound on 10/11/14 @ 2:45pm check in time.  Pt given # to reschedule if needed. Dr.Gherge office will contact pt to schedule appointment with her.

## 2014-10-07 NOTE — Addendum Note (Signed)
Addended by: Terrance Mass on: 10/07/2014 02:54 PM   Modules accepted: Orders

## 2014-10-07 NOTE — Patient Instructions (Signed)

## 2014-10-07 NOTE — Progress Notes (Signed)
Jessica [name] [birth year] [id number]   History:    48 y.o.  for who her annual gynecological examination. Patient is perimenopausal as documented by elevated FSH tested early this year. Patient is heterozygous for prothrombin 2 gene mutation that was detected when she was screened for thrombophilia due to the fact of both parents have had DVTs. Other testing were done and were as follows:  CBC normal PT PTT within normal limits  Protein C normal  Lupus anticoagulant not detected  Antithrombin 3 function normal  Fibrinogen normal  Anti-cardiolipin antibodies IgG IgM IgA normal range  Factor V mutation negative  Protein S normal  Patient is just using condoms for contraception. She's having no vasomotor symptoms. She still menstruating but skipped maybe a month or 2 at times. She has complained of some weight gain. Patient not currently sexually active.  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 Review of patient's records indicated she has a family history of colon cancer whereby her her grandfather and uncles and aunts have had colon cancer. Patient had a normal colonoscopy in 2010 and will need a followup colonoscopy 5 years from that date. Also patient has a great aunt as well as her sister with history of breast cancer and patient has been offered in the past undergo BRCA one BRCA2 testing but has refused.   Past medical history,surgical history, family history and social history were all reviewed and documented in the EPIC chart.  Gynecologic History Patient's last menstrual period was 09/16/2014. Contraception: condoms Last Pap: 2014. Results were: normal Last mammogram: 2015. Results were: Normal but dense had three-dimensional mammogram  Obstetric History OB History  Gravida Para Term Preterm AB SAB TAB Ectopic Multiple Living  _0 # Outcome Date GA Lbr Len/2nd Weight Sex Delivery Anes PTL Lv  2 SAB            1 Para     M Vag-Spont          ROS: A ROS was performed and pertinent positives and negatives are included in the history.  GENERAL: No fevers or chills. HEENT: No change in vision, no earache, sore throat or sinus congestion. NECK: No pain or stiffness. CARDIOVASCULAR: No chest pain or pressure. No palpitations. PULMONARY: No shortness of breath, cough or wheeze. GASTROINTESTINAL: No abdominal pain, nausea, vomiting or diarrhea, melena or bright red blood per rectum. GENITOURINARY: No urinary frequency, urgency, hesitancy or dysuria. MUSCULOSKELETAL: No joint or muscle pain, no back pain, no recent trauma. DERMATOLOGIC: No rash, no itching, no lesions. ENDOCRINE: No polyuria, polydipsia, no heat or cold intolerance. No recent change in weight. HEMATOLOGICAL: No anemia or easy bruising or bleeding. NEUROLOGIC: No headache, seizures, numbness, tingling or weakness. PSYCHIATRIC: No depression, no loss of interest in normal activity or change in sleep pattern.     Exam: chaperone present  BP 124/78 mmHg  Ht 5' 6.5" (1.689 m)  Wt 154 lb (69.854 kg)  BMI 24.49 kg/m2  LMP 09/16/2014  Body mass index is 24.49 kg/(m^2).  General appearance : Well developed well nourished female. No acute distress HEENT: Left lower pole thyroid nodule  Physical Exam  Neck:      Lungs: Clear to auscultation, no rhonchi or wheezes, or rib retractions  Heart: Regular rate and rhythm, no murmurs or gallops Breast:Examined in sitting and supine position were symmetrical in appearance, no palpable  masses or tenderness,  no skin retraction, no nipple inversion, no nipple discharge, no skin discoloration, no axillary or supraclavicular lymphadenopathy Abdomen: no palpable masses or tenderness, no rebound or guarding Extremities: no edema or skin discoloration or tenderness  Pelvic:  Bartholin, Urethra, Skene Glands: Within normal limits             Vagina: No gross lesions or discharge, right vaginal  wall cyst  Cervix: No gross lesions or discharge  Uterus  anteverted, normal size, shape and consistency, non-tender and mobile  Adnexa  Without masses or tenderness  Anus and perineum  normal   Rectovaginal  normal sphincter tone without palpated masses or tenderness             Hemoccult not indicated     Assessment/Plan:  48 y.o. female for annual exam who is perimenopausal complaining of weight gain with incidental finding of a left lower pole thyroid nodule. Thyroid panel will be obtained today and patient will be sent for a thyroid ultrasound and referral to medical endocrinologist. Patient is heterozygous for prothrombin 2 gene mutation that was detected when she was screened for thrombophilia due to the fact of both parents have had DVTs. The following labs were also ordered: CBC, comprehensive metabolic panel, lipid profile and urinalysis. Pap smear not done today in accordance to the new guidelines. Patient was reminded to do her monthly breast exams.   Terrance Mass MD, 2:43 PM 10/07/2014

## 2014-10-07 NOTE — Telephone Encounter (Signed)
-----   Message from Terrance Mass, MD sent at 10/07/2014  2:52 PM EST ----- Conley Rolls please schedule thyroid ultrasound on this patient with a left thyroid nodule. Also please make appointment for her to see the medical endocrinologist DrRenne Crigler with the [facility] medical group. Please contact the patient first because she needs to know if this will be part of her deductible this year if not she would like to schedule it for January after she establishes her health savings account

## 2014-10-08 LAB — URINALYSIS W MICROSCOPIC + REFLEX CULTURE
Bacteria, UA: NONE SEEN
Bilirubin Urine: NEGATIVE
CASTS: NONE SEEN
Crystals: NONE SEEN
GLUCOSE, UA: NEGATIVE mg/dL
Hgb urine dipstick: NEGATIVE
Ketones, ur: NEGATIVE mg/dL
LEUKOCYTES UA: NEGATIVE
Nitrite: NEGATIVE
PROTEIN: NEGATIVE mg/dL
SQUAMOUS EPITHELIAL / LPF: NONE SEEN
Specific Gravity, Urine: <1.005 — ABNORMAL LOW (ref 1.005–1.030)
Urobilinogen, UA: 0.2 mg/dL (ref 0.0–1.0)
pH: 6.5 (ref 5.0–8.0)

## 2014-10-08 LAB — COMPREHENSIVE METABOLIC PANEL
ALT: 16 U/L (ref 0–35)
AST: 21 U/L (ref 0–37)
Albumin: 4.6 g/dL (ref 3.5–5.2)
Alkaline Phosphatase: 38 U/L — ABNORMAL LOW (ref 39–117)
BILIRUBIN TOTAL: 0.5 mg/dL (ref 0.2–1.2)
BUN: 8 mg/dL (ref 6–23)
CO2: 26 meq/L (ref 19–32)
CREATININE: 0.61 mg/dL (ref 0.50–1.10)
Calcium: 9.7 mg/dL (ref 8.4–10.5)
Chloride: 100 mEq/L (ref 96–112)
Glucose, Bld: 94 mg/dL (ref 70–99)
Potassium: 4.3 mEq/L (ref 3.5–5.3)
Sodium: 137 mEq/L (ref 135–145)
Total Protein: 7.1 g/dL (ref 6.0–8.3)

## 2014-10-08 LAB — LIPID PANEL
CHOL/HDL RATIO: 2 ratio
CHOLESTEROL: 189 mg/dL (ref 0–200)
HDL: 96 mg/dL (ref >39)
LDL Cholesterol: 81 mg/dL (ref 0–99)
Triglycerides: 60 mg/dL (ref <150)
VLDL: 12 mg/dL (ref 0–40)

## 2014-10-11 ENCOUNTER — Ambulatory Visit (HOSPITAL_COMMUNITY)
Admission: RE | Admit: 2014-10-11 | Discharge: 2014-10-11 | Disposition: A | Discharge status: Home / Self Care | Payer: 59 | Source: Ambulatory Visit | Attending: Gynecology | Admitting: Gynecology

## 2014-10-11 DIAGNOSIS — E042 Nontoxic multinodular goiter: Secondary | ICD-10-CM | POA: Diagnosis not present

## 2014-10-11 DIAGNOSIS — E041 Nontoxic single thyroid nodule: Secondary | ICD-10-CM | POA: Diagnosis present

## 2014-10-12 NOTE — Telephone Encounter (Signed)
Appointment with [facility] office in 10/28/14

## 2014-10-28 ENCOUNTER — Ambulatory Visit: Payer: 59 | Admitting: Internal Medicine

## 2014-11-01 ENCOUNTER — Other Ambulatory Visit: Payer: Self-pay | Admitting: General Surgery

## 2014-11-02 ENCOUNTER — Other Ambulatory Visit: Payer: Self-pay | Admitting: General Surgery

## 2014-11-02 DIAGNOSIS — E041 Nontoxic single thyroid nodule: Secondary | ICD-10-CM

## 2014-11-02 LAB — T4: T4 TOTAL: 5.5 ug/dL (ref 4.5–12.0)

## 2014-11-02 LAB — TSH: TSH: 1.305 u[IU]/mL (ref 0.350–4.500)

## 2014-11-02 LAB — T3, FREE: T3, Free: 3 pg/mL (ref 2.3–4.2)

## 2014-11-08 ENCOUNTER — Encounter (INDEPENDENT_AMBULATORY_CARE_PROVIDER_SITE_OTHER): Payer: Self-pay

## 2014-11-08 ENCOUNTER — Other Ambulatory Visit (HOSPITAL_COMMUNITY)
Admission: RE | Admit: 2014-11-08 | Discharge: 2014-11-08 | Disposition: A | Discharge status: Home / Self Care | Payer: 59 | Source: Ambulatory Visit | Attending: Interventional Radiology | Admitting: Interventional Radiology

## 2014-11-08 ENCOUNTER — Ambulatory Visit
Admission: RE | Admit: 2014-11-08 | Discharge: 2014-11-08 | Disposition: A | Discharge status: Home / Self Care | Payer: 59 | Source: Ambulatory Visit | Attending: General Surgery | Admitting: General Surgery

## 2014-11-08 DIAGNOSIS — E041 Nontoxic single thyroid nodule: Secondary | ICD-10-CM

## 2014-11-16 ENCOUNTER — Telehealth: Payer: Self-pay | Admitting: *Deleted

## 2014-11-16 ENCOUNTER — Ambulatory Visit (INDEPENDENT_AMBULATORY_CARE_PROVIDER_SITE_OTHER): Payer: 59 | Admitting: Gynecology

## 2014-11-16 ENCOUNTER — Encounter: Payer: Self-pay | Admitting: Gynecology

## 2014-11-16 VITALS — BP 122/80 | Temp 97.6°F

## 2014-11-16 DIAGNOSIS — E041 Nontoxic single thyroid nodule: Secondary | ICD-10-CM

## 2014-11-16 MED ORDER — CEPHALEXIN 500 MG PO CAPS: ORAL_CAPSULE | ORAL | Status: DC

## 2014-11-16 MED ORDER — FLUCONAZOLE 150 MG PO TABS: 150.0000 mg | ORAL_TABLET | Freq: Once | ORAL | Status: DC

## 2014-11-16 NOTE — Telephone Encounter (Signed)
Referral placed at [facility], they will contact pt to schedule.

## 2014-11-16 NOTE — Telephone Encounter (Signed)
-----   Message from Terrance Mass, MD sent at 11/16/2014  3:19 PM EST ----- Please make appointment for this patient with a thyroid nodule with a medical endocrinologist Dr. Renne Crigler at [place] 7-10 days.

## 2014-11-16 NOTE — Progress Notes (Signed)
   Patient is a 49 year old was seen in the office on 10/07/2014 for her annual exam or by an incidental finding of a left thyroid nodule was detected. A thyroid ultrasound was ordered with the following noted:  Right thyroid lobe  Measurements: 5.5 x 1.7 x 1.6 cm. 4 mm upper pole complex nodule. 3 mm lower pole hypoechoic nodule.  Left thyroid lobe  Measurements: 4.9 x 2.0 x 1.9 cm. Complex upper pole nodule measures 2.6 x 1.7 x 2.0 cm. There are irregular soft tissue elements. 2 mm lower pole complex nodule.  Isthmus  Thickness: 2 mm. No nodules visualized.  Lymphadenopathy  None visualized.    With these findings she was referred to the general surgeon who did not recommend a biopsy at that time but was sent for ultrasound guided needle aspiration biopsy which was done on 11/08/2013. The pathology report was as follows:  Diagnosis THYROID, FINE NEEDLE ASPIRATION, LEFT UPPER POLE (SPECIMEN 1 OF 1 COLLECTED 11-08-2014) CONSISTENT WITH BENIGN FOLLICULAR NODULE (BETHESDA CATEGORY II).  On 11/01/2014 patient had a normal thyroid panel. Patient presented to the office today stating that since the biopsy last week she feels that the nodule has gone somewhat bigger and tender and she felt like she may have had a local grade temperature. Her temperature in the office today was 97.6 and her blood pressure was 122/80.  Exam: Physical Exam  Neck:      Assessment/plan: Persistence of left thyroid nodule possible tissue trauma and irritation contributing to its increase in size from time of biopsy last week. Because of underlying inflammation we'll go ahead and start her on Keflex 500 mg one by mouth twice a day for 10 days. She will take Advil around-the-clock when necessary. I will make arrangements for Patient to see the medical endocrinologist for follow-up next week.

## 2014-11-16 NOTE — Addendum Note (Signed)
Addended by: Terrance Mass on: 11/16/2014 03:22 PM   Modules accepted: Orders

## 2014-11-17 LAB — CBC WITH DIFFERENTIAL/PLATELET
BASOS ABS: 0 10*3/uL (ref 0.0–0.1)
Basophils Relative: 0 % (ref 0–1)
EOS ABS: 0 10*3/uL (ref 0.0–0.7)
Eosinophils Relative: 0 % (ref 0–5)
HEMATOCRIT: 41 % (ref 36.0–46.0)
Hemoglobin: 13.8 g/dL (ref 12.0–15.0)
Lymphocytes Relative: 30 % (ref 12–46)
Lymphs Abs: 2.4 10*3/uL (ref 0.7–4.0)
MCH: 31 pg (ref 26.0–34.0)
MCHC: 33.7 g/dL (ref 30.0–36.0)
MCV: 92.1 fL (ref 78.0–100.0)
MONO ABS: 0.6 10*3/uL (ref 0.1–1.0)
MPV: 10.5 fL (ref 8.6–12.4)
Monocytes Relative: 7 % (ref 3–12)
NEUTROS PCT: 63 % (ref 43–77)
Neutro Abs: 5 10*3/uL (ref 1.7–7.7)
Platelets: 225 10*3/uL (ref 150–400)
RBC: 4.45 MIL/uL (ref 3.87–5.11)
RDW: 12.9 % (ref 11.5–15.5)
WBC: 7.9 10*3/uL (ref 4.0–10.5)

## 2014-11-22 NOTE — Telephone Encounter (Signed)
Appointment 11/25/14 @ 2:15 pm

## 2014-11-25 ENCOUNTER — Ambulatory Visit: Payer: 59 | Admitting: Internal Medicine

## 2014-12-01 ENCOUNTER — Encounter: Payer: Self-pay | Admitting: Internal Medicine

## 2014-12-01 ENCOUNTER — Ambulatory Visit (INDEPENDENT_AMBULATORY_CARE_PROVIDER_SITE_OTHER): Payer: 59 | Admitting: Internal Medicine

## 2014-12-01 VITALS — BP 112/58 | HR 68 | Temp 98.0°F | Resp 12 | Ht 66.5 in | Wt 154.0 lb

## 2014-12-01 DIAGNOSIS — E042 Nontoxic multinodular goiter: Secondary | ICD-10-CM

## 2014-12-01 NOTE — Progress Notes (Signed)
Patient ID: Jessica [name], female   DOB: 10-03-1966, 49 y.o.   MRN: [id number]   HPI  Jessica [name] is a 49 y.o.-year-old female, referred by her ObGyn Dr. Dr Toney Rakes, for evaluation for thyroid nodules.  Pt was found to have a thyroid nodule at her annual exam with Dr Toney Rakes. She was sent for  Thyroid U/S:  Thyroid U/S (10/11/2014): large, 2.6 x 1.7 x 2.0 cm L thyroid nodule, mostly cystic. Other smaller nodules throughout the thyroid.    FNA (11/08/2014): benign  She developed pain in neckafter the Bx >> tx with Keflex. Now improved.  Pt does feel her L thyroid on palpation of her neck but no hoarseness, dysphagia/odynophagia, SOB with lying down.  She tells me that she also saw Dr Rosendo Gros (surgery), but would like to avoid surgery if possible.  I reviewed pt's thyroid tests - all normal Lab Results  Component Value Date   TSH 1.305 11/01/2014   TSH 1.097 12/21/2013   TSH 1.534 03/24/2013   TSH 1.168 09/11/2012    Pt c/o: - + hot flushes - + weight gain - no tremors - no palpitations - no anxiety/depression - no hyperdefecation/constipation - no dry skin - no hair falling - no problems with concentration - no fatigue  Pt does not have a FH of thyroid ds. No FH of thyroid cancer. No h/o radiation tx to head or neck.  No seaweed or kelp, no recent contrast studies. No steroid use. No herbal supplements.   I reviewed her chart and she also has a history of  Prothrombin 2 mutation, endometriosis.  ROS: Constitutional: see HPI Eyes: no blurry vision, no xerophthalmia ENT: no sore throat, + nodules palpated in throat, no dysphagia/odynophagia, no hoarseness Cardiovascular: no CP/SOB/palpitations/leg swelling Respiratory: no cough/SOB Gastrointestinal: no N/V/D/C Musculoskeletal: no muscle/joint aches Skin: no rashes Neurological: no tremors/numbness/tingling/dizziness Psychiatric: no depression/anxiety  Past Medical History  Diagnosis Date  .  Endometriosis   . HSV-2 (herpes simplex virus 2) infection   . 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 Inguinal hernia repair      LEFT  . Tonsillectomy    . Bunionectomy      BOTH FEET  . Pelvic laparoscopy      ENDOMETRIOSIS   History   Social History  . Marital Status: Divorced    Spouse Name: N/A  . Number of Children: 1   Occupational History  . HR   Social History Main Topics  . Smoking status: Former Smoker    Types: Cigarettes    Quit date: 01/18/2007  . Smokeless tobacco: Never Used  . Alcohol Use:          Comment: 1-2 glasses of wine/day  . Drug Use: No   Current Outpatient Prescriptions on File Prior to Visit  Medication Sig Dispense Refill  . calcium carbonate (OS-CAL) 600 MG TABS Take 600 mg by mouth 2 (two) times daily with a meal.      . cephALEXin (KEFLEX) 500 MG capsule One PO BID for 10 days 20 capsule 0  . Cetirizine HCl (ZYRTEC PO) Take by mouth daily. 1/2 pill daily     . fluconazole (DIFLUCAN) 150 MG tablet Take 1 tablet (150 mg total) by mouth once. 1 tablet 0  . Multiple Vitamin (MULTIVITAMIN) tablet Take 1 tablet by mouth daily.      . Naproxen Sodium (ALEVE PO) Take by mouth daily. 2-4 daily     .  NONFORMULARY OR COMPOUNDED ITEM Boric Acid Suppository 600mg  apply qhs for 21 days vaginaly. 21 each 1  . OVER THE COUNTER MEDICATION [place]- HERBAL    . PARoxetine Mesylate 7.5 MG CAPS Take 7.5 mg by mouth daily. 30 capsule 11  . valACYclovir (VALTREX) 1000 MG tablet Take 1 tablet (1,000 mg total) by mouth daily. 30 tablet 11   No current facility-administered medications on file prior to visit.   Allergies  Allergen Reactions  . Codeine Nausea Only   Family History  Problem Relation Age of Onset  . Cancer Sister     breast  . Breast cancer Maternal Aunt   . Cancer Maternal Aunt     COLON  . Cancer Paternal Uncle     COLON  . Cancer Maternal Grandmother      OVARIAN  . Diabetes Paternal Grandmother   . Cancer Paternal Grandfather     COLON   PE: BP 112/58 mmHg  Pulse 68  Temp(Src) 98 F (36.7 C) (Oral)  Resp 12  Ht 5' 6.5" (1.689 m)  Wt 154 lb (69.854 kg)  BMI 24.49 kg/m2  SpO2 99%  LMP 10/15/2014 Wt Readings from Last 3 Encounters:  12/01/14 154 lb (69.854 kg)  10/07/14 154 lb (69.854 kg)  12/21/13 145 lb (65.772 kg)   Constitutional: normal weight, in NAD Eyes: PERRLA, EOMI, no exophthalmos ENT: moist mucous membranes, no thyromegaly, + L thyroid nodule easily palpable, hard consistency; no cervical lymphadenopathy Cardiovascular: RRR, No MRG Respiratory: CTA B Gastrointestinal: abdomen soft, NT, ND, BS+ Musculoskeletal: no deformities, strength intact in all 4;  Skin: moist, warm, no rashes Neurological: no tremor with outstretched hands, DTR normal in all 4  ASSESSMENT: 1. MNG - thyroid U/S: large cystic L nodule - reviewed TFTs since 2011 >> all normal  PLAN: 1. MNG  - I reviewed the images of her thyroid ultrasound along with the patient. I pointed out that the dominant nodule is large, but mostly cystic, without calcifications, without internal blood flow, more wide than tall, and well delimited from surrounding tissue. Pt does not have a thyroid cancer family history or a personal history of RxTx to head/neck. All these would favor benignity. The fact that she had a recent Bx that was benign is very reassuring. - she had some neck compression sxs right after the FAN, most likely 2/2 bleeding in the cyst 2/2 the procedure. Now much better. She still has a little pain on L side of neck, but not bothersome. - we discussed that we can drain the cyst if she develops more compression sxs >> she will need to let me know. Surgery is not indicated, unless she does not get symptom relief from draining or if she requires multiple drainings. I did explain that, while thyroid surgery (usually hemithyroidectomy) is not a complicated  one, it still can have side effects and also she might have a risk of ~25% of becoming hypothyroid after hemithyroidectomy. Ethanol injection can also be done, but not available here. - if not symptomatic, we can continue to follow her on a yearly basis, and check another ultrasound in another year or 2.  - I'll see her back in a year - I advised pt to join my chart

## 2014-12-01 NOTE — Patient Instructions (Signed)
Please let me know if you develop more compression symptoms from the thyroid nodule >> we can drain it if this happens.  Please return in 1 year.  Thyroid Cyst The thyroid gland is a butterfly-shaped gland in the middle of the neck, located just below the voice box. It makes thyroid hormone. Thyroid hormone has an effect on nearly all tissues of your body by regulating your metabolism. Metabolism is the breakdown and use of food that you eat or energy that is stored in your body. Your metabolism affects your heart rate, blood pressure, body temperature, and weight. Thyroid cysts are enlarged fluid filled regions of the thyroid gland. These cysts range in size and may expand and enlarge suddenly. Rapidly expanding cysts may cause pain, difficulty swallowing, and rarely, difficulty breathing. Most cysts of the thyroid are not cancerous (benign). SYMPTOMS Bleeding may occur within the cyst. If the bleeding is severe, the cyst may get larger and produce problems in the neck, including swelling that may produce pain and difficultly swallowing. If the vocal cords are compressed, hoarseness may occur. If the windpipe is compressed, you may have difficulty breathing. DIAGNOSIS  A thyroid cyst is diagnosed through physical exam. The diagnosis can be confirmed by an ultrasound exam of the neck. This creates a picture by bouncing sound waves off the thyroid gland. Sometimes the cysts are drained using a fine needle. The fluid is then sent to the lab where it can be examined. This is done to see if any cells in the fluid are cancerous. If they are found to be cancerous, you will need further treatment.  TREATMENT  If the fluid in your neck does not show evidence of cancer, your caregiver may just want to monitor you with yearly ultrasound exams. Sometimes cysts need to be removed surgically. Document Released: 08/30/2004 Document Revised: 12/30/2011 Document Reviewed: 12/13/2010 [facility] Patient Information  2015 [place], [place]. This information is not intended to replace advice given to you by your health care provider. Make sure you discuss any questions you have with your health care provider.

## 2015-02-01 ENCOUNTER — Telehealth: Payer: Self-pay | Admitting: *Deleted

## 2015-02-01 NOTE — Telephone Encounter (Signed)
Pt called c/o of fever 101, requesting Rx for Tamilflu unable to get Rx from PCP because not seen more than 1 year. I advised pt to call PCP and make OV or go to urgent care to be seen.

## 2015-03-01 ENCOUNTER — Telehealth: Payer: Self-pay | Admitting: *Deleted

## 2015-03-01 NOTE — Telephone Encounter (Signed)
Pt informed

## 2015-03-01 NOTE — Telephone Encounter (Signed)
Anyone at [facility] urology is good. Dr. Gaynelle Arabian or Dr. Diona Fanti or Dr. Matilde Sprang

## 2015-03-01 NOTE — Telephone Encounter (Signed)
Pt called to asked the name of a MD that does Vasectomy. Please advise

## 2015-04-18 ENCOUNTER — Telehealth: Payer: Self-pay | Admitting: *Deleted

## 2015-04-18 NOTE — Telephone Encounter (Signed)
Pt called c/o no cycle since Dec. Question if she is in full blown menopause. I explained to pt that OV would be needed to determine that. Transferred to appointment desk.

## 2015-04-21 ENCOUNTER — Ambulatory Visit (INDEPENDENT_AMBULATORY_CARE_PROVIDER_SITE_OTHER): Payer: 59 | Admitting: Women's Health

## 2015-04-21 ENCOUNTER — Encounter: Payer: Self-pay | Admitting: Women's Health

## 2015-04-21 DIAGNOSIS — N898 Other specified noninflammatory disorders of vagina: Secondary | ICD-10-CM

## 2015-04-21 DIAGNOSIS — N912 Amenorrhea, unspecified: Secondary | ICD-10-CM | POA: Diagnosis not present

## 2015-04-21 LAB — WET PREP FOR TRICH, YEAST, CLUE
CLUE CELLS WET PREP: NONE SEEN
Trich, Wet Prep: NONE SEEN
WBC, Wet Prep HPF POC: NONE SEEN
YEAST WET PREP: NONE SEEN

## 2015-04-21 LAB — PREGNANCY, URINE: Preg Test, Ur: NEGATIVE

## 2015-04-21 MED ORDER — FLUCONAZOLE 150 MG PO TABS: 150.0000 mg | ORAL_TABLET | Freq: Once | ORAL | Status: DC

## 2015-04-21 NOTE — Progress Notes (Signed)
Patient ID: Jessica [name], female   DOB: 17-Jul-1966, 49 y.o.   MRN: [id number] Presents with amenorrhea and vaginal irritation/discharge. Has not had a period since December 2015.  [place] 73 12/2013.  Denies cramping, fatigue, nausea. Sexually active/same partner/condoms use. External vaginal irritation and discharge for 2 weeks. Denies burning, pain, urinary symptoms, fever, chills. Treated with OTC Monistat, no relief.   Exam: Appears well Erythematous external genitalia.  Speculum exam normal vaginal mucosa, no discharge - Negative Wet Prep.  UPT negative  Vaginal irritation/discharge Perimenopausal/condom use  Plan: Diflucan 150 mg PO once. Yeast prevention and hygiene reviewed. Menopause  reviewed/condom use encouraged until amenorrhea x 1 year. [place] pending. Refill of boric acid 600 mg gelcaps twice weekly given.

## 2015-04-21 NOTE — Patient Instructions (Signed)
Monilial Vaginitis Vaginitis in a soreness, swelling and redness (inflammation) of the vagina and vulva. Monilial vaginitis is not a sexually transmitted infection. CAUSES  Yeast vaginitis is caused by yeast (candida) that is normally found in your vagina. With a yeast infection, the candida has overgrown in number to a point that upsets the chemical balance. SYMPTOMS   White, thick vaginal discharge.  Swelling, itching, redness and irritation of the vagina and possibly the lips of the vagina (vulva).  Burning or painful urination.  Painful intercourse. DIAGNOSIS  Things that may contribute to monilial vaginitis are:  Postmenopausal and virginal states.  Pregnancy.  Infections.  Being tired, sick or stressed, especially if you had monilial vaginitis in the past.  Diabetes. Good control will help lower the chance.  Birth control pills.  Tight fitting garments.  Using bubble bath, feminine sprays, douches or deodorant tampons.  Taking certain medications that kill germs (antibiotics).  Sporadic recurrence can occur if you become ill. TREATMENT  Your caregiver will give you medication.  There are several kinds of anti monilial vaginal creams and suppositories specific for monilial vaginitis. For recurrent yeast infections, use a suppository or cream in the vagina 2 times a week, or as directed.  Anti-monilial or steroid cream for the itching or irritation of the vulva may also be used. Get your caregiver's permission.  Painting the vagina with methylene blue solution may help if the monilial cream does not work.  Eating yogurt may help prevent monilial vaginitis. HOME CARE INSTRUCTIONS   Finish all medication as prescribed.  Do not have sex until treatment is completed or after your caregiver tells you it is okay.  Take warm sitz baths.  Do not douche.  Do not use tampons, especially scented ones.  Wear cotton underwear.  Avoid tight pants and panty  hose.  Tell your sexual partner that you have a yeast infection. They should go to their caregiver if they have symptoms such as mild rash or itching.  Your sexual partner should be treated as well if your infection is difficult to eliminate.  Practice safer sex. Use condoms.  Some vaginal medications cause latex condoms to fail. Vaginal medications that harm condoms are:  Cleocin cream.  Butoconazole (Femstat).  Terconazole (Terazol) vaginal suppository.  Miconazole (Monistat) (may be purchased over the counter). SEEK MEDICAL CARE IF:   You have a temperature by mouth above 102 F (38.9 C).  The infection is getting worse after 2 days of treatment.  The infection is not getting better after 3 days of treatment.  You develop blisters in or around your vagina.  You develop vaginal bleeding, and it is not your menstrual period.  You have pain when you urinate.  You develop intestinal problems.  You have pain with sexual intercourse. Document Released: 07/17/2005 Document Revised: 12/30/2011 Document Reviewed: 03/31/2009 [facility] Patient Information 2015 [place], [place]. This information is not intended to replace advice given to you by your health care provider. Make sure you discuss any questions you have with your health care provider. Menopause Menopause is the normal time of life when menstrual periods stop completely. Menopause is complete when you have missed 12 consecutive menstrual periods. It usually occurs between the ages of 64 years and 70 years. Very rarely does a woman develop menopause before the age of 93 years. At menopause, your ovaries stop producing the female hormones estrogen and progesterone. This can cause undesirable symptoms and also affect your health. Sometimes the symptoms may occur 4-5 years before  the menopause begins. There is no relationship between menopause and:  Oral contraceptives.  Number of children you had.  Race.  The age your  menstrual periods started (menarche). Heavy smokers and very thin women may develop menopause earlier in life. CAUSES  The ovaries stop producing the female hormones estrogen and progesterone.  Other causes include:  Surgery to remove both ovaries.  The ovaries stop functioning for no known reason.  Tumors of the pituitary gland in the brain.  Medical disease that affects the ovaries and hormone production.  Radiation treatment to the abdomen or pelvis.  Chemotherapy that affects the ovaries. SYMPTOMS   Hot flashes.  Night sweats.  Decrease in sex drive.  Vaginal dryness and thinning of the vagina causing painful intercourse.  Dryness of the skin and developing wrinkles.  Headaches.  Tiredness.  Irritability.  Memory problems.  Weight gain.  Bladder infections.  Hair growth of the face and chest.  Infertility. More serious symptoms include:  Loss of bone (osteoporosis) causing breaks (fractures).  Depression.  Hardening and narrowing of the arteries (atherosclerosis) causing heart attacks and strokes. DIAGNOSIS   When the menstrual periods have stopped for 12 straight months.  Physical exam.  Hormone studies of the blood. TREATMENT  There are many treatment choices and nearly as many questions about them. The decisions to treat or not to treat menopausal changes is an individual choice made with your health care provider. Your health care provider can discuss the treatments with you. Together, you can decide which treatment will work best for you. Your treatment choices may include:   Hormone therapy (estrogen and progesterone).  Non-hormonal medicines.  Treating the individual symptoms with medicine (for example antidepressants for depression).  Herbal medicines that may help specific symptoms.  Counseling by a psychiatrist or psychologist.  Group therapy.  Lifestyle changes including:  Eating healthy.  Regular exercise.  Limiting  caffeine and alcohol.  Stress management and meditation.  No treatment. HOME CARE INSTRUCTIONS   Take the medicine your health care provider gives you as directed.  Get plenty of sleep and rest.  Exercise regularly.  Eat a diet that contains calcium (good for the bones) and soy products (acts like estrogen hormone).  Avoid alcoholic beverages.  Do not smoke.  If you have hot flashes, dress in layers.  Take supplements, calcium, and vitamin D to strengthen bones.  You can use over-the-counter lubricants or moisturizers for vaginal dryness.  Group therapy is sometimes very helpful.  Acupuncture may be helpful in some cases. SEEK MEDICAL CARE IF:   You are not sure you are in menopause.  You are having menopausal symptoms and need advice and treatment.  You are still having menstrual periods after age 34 years.  You have pain with intercourse.  Menopause is complete (no menstrual period for 12 months) and you develop vaginal bleeding.  You need a referral to a specialist (gynecologist, psychiatrist, or psychologist) for treatment. SEEK IMMEDIATE MEDICAL CARE IF:   You have severe depression.  You have excessive vaginal bleeding.  You fell and think you have a broken bone.  You have pain when you urinate.  You develop leg or chest pain.  You have a fast pounding heart beat (palpitations).  You have severe headaches.  You develop vision problems.  You feel a lump in your breast.  You have abdominal pain or severe indigestion. Document Released: 12/28/2003 Document Revised: 06/09/2013 Document Reviewed: 05/06/2013 [facility] Patient Information 2015 [place], [place]. This information is not intended  to replace advice given to you by your health care provider. Make sure you discuss any questions you have with your health care provider.  

## 2015-04-22 LAB — FOLLICLE STIMULATING HORMONE: FSH: 35.6 m[IU]/mL

## 2015-04-26 ENCOUNTER — Telehealth: Payer: Self-pay | Admitting: *Deleted

## 2015-04-26 NOTE — Telephone Encounter (Signed)
Pt asked if she could still be at risk of pregnancy?

## 2015-04-26 NOTE — Telephone Encounter (Signed)
Pt aware.

## 2015-04-26 NOTE — Telephone Encounter (Signed)
-----   Message from Huel Cote, NP sent at 04/24/2015 11:16 AM EDT ----- Please call and review [place] now 36, was 73, anything > 25 menopause, call any further bleeding.

## 2015-04-26 NOTE — Telephone Encounter (Signed)
Best to use contraception until amenorrheic for 1 year with an elevated FSH. Unlikely that she is ovulating.

## 2015-07-12 ENCOUNTER — Telehealth: Payer: Self-pay | Admitting: *Deleted

## 2015-07-12 MED ORDER — ACYCLOVIR 5 % EX CREA: 1.0000 "application " | TOPICAL_CREAM | CUTANEOUS | Status: DC

## 2015-07-12 NOTE — Telephone Encounter (Signed)
Ok, thank you

## 2015-07-12 NOTE — Telephone Encounter (Signed)
Pt called requesting Rx for Zovirax 5% cream, has Rx for Valtrex but would prefer cream, states it works better. Rx will be sent.

## 2015-07-13 ENCOUNTER — Telehealth: Payer: Self-pay

## 2015-07-13 ENCOUNTER — Other Ambulatory Visit: Payer: Self-pay

## 2015-07-13 NOTE — Telephone Encounter (Signed)
Patient called to state her boyfriend has a place on him and she feels sure it is HSV since she has had it since high school and knows what it looks like. She wanted to know if Dr. Moshe Salisbury would prescribe Valtrex for him.  I told patient that is not something Dr. Moshe Salisbury can do and that partner needs to be seen by MD to confirm that diagnosis prior to treatment. She said he works irregular and late hours as a Scientist, clinical (histocompatibility and immunogenetics) and it makes it difficult to see a doctor. I rec Urgent Care of if he absolutely had to he could go the ER when he got off work late.

## 2015-07-13 NOTE — Telephone Encounter (Signed)
Patient called because she went to pick up her rx for Zovirax cream yesterday and it was going to cost her $205.00. IN past she has just had a $35 copayment. She asked about alternatives. I spoke with pharmacist. He said that her plan does not cover the generic Zovirax ointment and Zovirax is the only topical available. He said her ins co wanting patients to use the oral Rx.    Patient advised. She said she is using oral along with the cream. She said she has had a lot of stress and has outbreak. She usually takes 500 mg Valtrex daily for preventative and now that she has outbreak she is taking one gram in the morning and one gram in the evening. She asked me if that was okay. I told her I would be happy to check with her MD to see if okay. She said to never mind as she looked it up and knows it is okay.

## 2015-10-12 ENCOUNTER — Encounter: Payer: Self-pay | Admitting: Gynecology

## 2015-10-12 ENCOUNTER — Ambulatory Visit (INDEPENDENT_AMBULATORY_CARE_PROVIDER_SITE_OTHER): Payer: 59 | Admitting: Gynecology

## 2015-10-12 ENCOUNTER — Ambulatory Visit
Admission: RE | Admit: 2015-10-12 | Discharge: 2015-10-12 | Disposition: A | Discharge status: Home / Self Care | Payer: 59 | Source: Ambulatory Visit

## 2015-10-12 VITALS — BP 118/76 | Ht 64.5 in | Wt 145.0 lb

## 2015-10-12 DIAGNOSIS — N951 Menopausal and female climacteric states: Secondary | ICD-10-CM

## 2015-10-12 DIAGNOSIS — Z01419 Encounter for gynecological examination (general) (routine) without abnormal findings: Secondary | ICD-10-CM

## 2015-10-12 DIAGNOSIS — Z23 Encounter for immunization: Secondary | ICD-10-CM | POA: Diagnosis not present

## 2015-10-12 DIAGNOSIS — N898 Other specified noninflammatory disorders of vagina: Secondary | ICD-10-CM

## 2015-10-12 DIAGNOSIS — Z1231 Encounter for screening mammogram for malignant neoplasm of breast: Secondary | ICD-10-CM

## 2015-10-12 LAB — WET PREP FOR TRICH, YEAST, CLUE
CLUE CELLS WET PREP: NONE SEEN
TRICH WET PREP: NONE SEEN
WBC, Wet Prep HPF POC: NONE SEEN
Yeast Wet Prep HPF POC: NONE SEEN

## 2015-10-12 MED ORDER — FLUCONAZOLE 150 MG PO TABS: 150.0000 mg | ORAL_TABLET | Freq: Once | ORAL | Status: DC

## 2015-10-12 MED ORDER — VALACYCLOVIR HCL 1 G PO TABS: 1000.0000 mg | ORAL_TABLET | Freq: Every day | ORAL | Status: DC

## 2015-10-12 NOTE — Progress Notes (Signed)
Jessica [name] Jul 20, 1966 [id number]   History:    49 y.o.  for annual gyn exam  With the only exception that occasionally she'll skip cycles and has very mild vasomotor symptoms. In July of this year she had an [facility] that was in the menopausal range. Patient sometimes complains of vaginal irritation and questionable slight discharge recently for which a wet prep was done today which was negative. She has been using condoms for contraception since she is sexually active.   Patient is heterozygous for prothrombin 2 gene mutation that was detected when she was screened for thrombophilia due to the fact of both parents have had DVTs. 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 Review of patient's records indicated she has a family history of colon cancer whereby her her grandfather and uncles and aunts have had colon cancer. Patient had a normal colonoscopy in 2010 . Patient had a colonoscopy in 2015 and benign polyps were removed and she is on a 3 year recall. Patient with no past history of any abnormal Pap smears.Also patient has a great aunt as well as her sister with history of breast cancer and patient has been offered in the past undergo BRCA one BRCA2 testing but has refused.   in December 2015 she was found to have a Left thyroid nodule an had an ultrasound and fine-needle aspiration with the following reported:  IMPRESSION: Bilateral nodules. Left upper pole complex nodule is dominant and measures 2.6 cm. Findings meet consensus criteria for biopsy. Ultrasound-guided fine needle aspiration should be considered,   Cytology from fine-needle  aspiration demonstrated the following: Diagnosis THYROID, FINE NEEDLE ASPIRATION, LEFT UPPER POLE (SPECIMEN 1 OF 1 COLLECTED 11-08-2014) CONSISTENT WITH BENIGN FOLLICULAR NODULE (BETHESDA CATEGORY II).  Patient has not felt a thyroid nodule on self exam.   Past medical history,surgical history, family history and social history were all reviewed and documented in the EPIC chart.  Gynecologic History No LMP recorded. Patient is not currently having periods (Reason: Perimenopausal). Contraception: condoms Last Pap: 2014. Results were: normal Last mammogram: 2015. Results were: normal  Obstetric History OB History  Gravida Para Term Preterm AB SAB TAB Ectopic Multiple Living  _0 # Outcome Date GA Lbr Len/2nd Weight Sex Delivery Anes PTL Lv  2 SAB           1 Para     M Vag-Spont          ROS: A ROS was performed and pertinent positives and negatives are included in the history.  GENERAL: No fevers or chills. HEENT: No change in vision, no earache, sore throat or sinus congestion. NECK: No pain or stiffness. CARDIOVASCULAR: No chest pain or pressure. No palpitations. PULMONARY: No shortness of breath, cough or wheeze. GASTROINTESTINAL: No abdominal pain, nausea, vomiting or diarrhea, melena or bright red blood per rectum. GENITOURINARY: No urinary frequency, urgency, hesitancy or dysuria. MUSCULOSKELETAL: No joint or muscle pain, no back pain, no recent trauma. DERMATOLOGIC: No rash, no itching, no lesions. ENDOCRINE: No polyuria, polydipsia, no heat or cold intolerance. No recent change in weight.  HEMATOLOGICAL: No anemia or easy bruising or bleeding. NEUROLOGIC: No headache, seizures, numbness, tingling or weakness. PSYCHIATRIC: No depression, no loss of interest in normal activity or change in sleep pattern.     Exam: chaperone present  BP 118/76 mmHg  Ht 5' 4.5" (1.638 m)  Wt 145 lb (65.772 kg)  BMI 24.51 kg/m2  Body mass index is 24.51 kg/(m^2).  General  appearance : Well developed well nourished female. No acute distress HEENT: Eyes: no retinal hemorrhage or exudates,  Neck supple, trachea midline, no carotid bruits, no thyroidmegaly Lungs: Clear to auscultation, no rhonchi or wheezes, or rib retractions  Heart: Regular rate and rhythm, no murmurs or gallops Breast:Examined in sitting and supine position were symmetrical in appearance, no palpable masses or tenderness,  no skin retraction, no nipple inversion, no nipple discharge, no skin discoloration, no axillary or supraclavicular lymphadenopathy Abdomen: no palpable masses or tenderness, no rebound or guarding Extremities: no edema or skin discoloration or tenderness  Pelvic:  Bartholin, Urethra, Skene Glands: Within normal limits             Vagina: No gross lesions or discharge  Cervix: No gross lesions or discharge  Uterus  anteverted, normal size, shape and consistency, non-tender and mobile  Adnexa  Without masses or tenderness  Anus and perineum  normal   Rectovaginal  normal sphincter tone without palpated masses or tenderness             Hemoccult cards will be provided     Assessment/Plan:  49 y.o. female for annual exam status post fine-needle aspiration of a benign thyroid cyst in 2015 has done well normal thyroid exam today.patient will return back to the office in a fasting state next week for the following screening blood work: Fasting lipid profile, comprehensive metabolic panel, TSH, CBC, and urinalysis. Wet prep today was negative. Because of patient's history of heterozygous for prothrombin 2 gene mutation in both para for DVT she would not be a candidate for hormonal replacement therapy. Since her vasomotor symptoms are very far in between she could use peppermint oil and apply behind-the-ear when necessary for infrequent hot flashes. When patient returns for her blood work she's going to bring with her the fecal Hemoccult cards for testing. She had her mammogram today  result pending at time of this dictation. We recommended monthly self breast exams.   Terrance Mass MD, 12:31 PM 10/12/2015

## 2015-10-12 NOTE — Addendum Note (Signed)
Addended by: Joaquin Music on: 10/12/2015 12:49 PM   Modules accepted: Orders

## 2015-10-12 NOTE — Addendum Note (Signed)
Addended by: Thurnell Garbe A on: 10/12/2015 12:52 PM   Modules accepted: Orders, SmartSet

## 2015-10-13 ENCOUNTER — Encounter: Payer: 59 | Admitting: Gynecology

## 2015-10-13 LAB — URINALYSIS W MICROSCOPIC + REFLEX CULTURE
Bacteria, UA: NONE SEEN [HPF]
Bilirubin Urine: NEGATIVE
CASTS: NONE SEEN [LPF]
CRYSTALS: NONE SEEN [HPF]
Glucose, UA: NEGATIVE
HGB URINE DIPSTICK: NEGATIVE
Ketones, ur: NEGATIVE
Leukocytes, UA: NEGATIVE
NITRITE: NEGATIVE
PH: 7 (ref 5.0–8.0)
Protein, ur: NEGATIVE
RBC / HPF: NONE SEEN RBC/HPF (ref <=2)
SQUAMOUS EPITHELIAL / LPF: NONE SEEN [HPF] (ref <=5)
Specific Gravity, Urine: 1.003 (ref 1.001–1.035)
WBC, UA: NONE SEEN WBC/HPF (ref <=5)
YEAST: NONE SEEN [HPF]

## 2015-10-27 ENCOUNTER — Telehealth: Payer: Self-pay | Admitting: *Deleted

## 2015-10-27 MED ORDER — SERTRALINE HCL 50 MG PO TABS: 50.0000 mg | ORAL_TABLET | Freq: Every day | ORAL | Status: DC

## 2015-10-27 MED ORDER — ESTRADIOL 10 MCG VA TABS: 1.0000 | ORAL_TABLET | VAGINAL | Status: DC

## 2015-10-27 NOTE — Telephone Encounter (Signed)
Spoke with patient this morning about her mood swings and irritability with some hot flashes. Patient currently on no hormone replacement therapy. Patient with history of heterogeneous prothrombin 2 gene mutation. Both parents with history of DVTs. Patient's aunt and sister with history of breast cancer. Patient also complaining of vaginal dryness. Patient tried over-the-counter peppermint oral for some her vasomotor symptoms with slight improvement.  Patient is currently seeing her therapist because she suffers from OCD and was considering the possibility of starting her on an SSRI. Patient did state that when she was younger she was on the oral contraceptive pills for many years and was smoking did not have any problems. She would prefer not to go on HRT. We discussed starting her on Zoloft 50 mg one by mouth daily and to continue to follow-up with her therapist. As well. If after a month her symptoms do not improve I have asked her to take 1-1/2 tablets which would be equal and 75 mg daily. Also for her vaginal dryness we will call in a prescription for Vagifem 10 g to apply twice a week. The risks benefits and pros and cons of all the above was discussed with the patient on phone. Patient otherwise scheduled to return back at the end of the year for annual exam or when necessary. Of note patient is using condoms at the present time during intercourse. Her [place] in July 2016 was in the menopausal range with a value of 35.6.

## 2015-10-27 NOTE — Telephone Encounter (Signed)
Please call in prescription for Zoloft 50 mg one by mouth daily. #30 with 11 refills. Also: Vagifem 10 g tablet to apply intravaginally twice a week. Call in a tablets with 11 refills.

## 2015-10-27 NOTE — Telephone Encounter (Signed)
Both Rx sent.

## 2015-10-27 NOTE — Telephone Encounter (Signed)
Pt called requesting to speak with you via phone [phone] is her number all day to reach her. Pt c/o extreme hot flashes last night, has been having issues with rage, very emotional, never started the peppermint oil, states she can't take any hormone medication due to "heterozygous for prothrombin 2 gene mutation in both para for DVT". Pt would like to speak with you regardless and your convenience. Please advise

## 2015-10-30 ENCOUNTER — Encounter (HOSPITAL_BASED_OUTPATIENT_CLINIC_OR_DEPARTMENT_OTHER): Payer: Self-pay | Admitting: Emergency Medicine

## 2015-10-30 ENCOUNTER — Other Ambulatory Visit: Payer: Self-pay

## 2015-10-30 ENCOUNTER — Telehealth: Payer: Self-pay

## 2015-10-30 ENCOUNTER — Telehealth: Payer: Self-pay | Admitting: Cardiovascular Disease

## 2015-10-30 ENCOUNTER — Emergency Department (HOSPITAL_BASED_OUTPATIENT_CLINIC_OR_DEPARTMENT_OTHER)
Admission: EM | Admit: 2015-10-30 | Discharge: 2015-10-30 | Disposition: A | Discharge status: Home / Self Care | Payer: 59 | Attending: Emergency Medicine | Admitting: Emergency Medicine

## 2015-10-30 DIAGNOSIS — R42 Dizziness and giddiness: Secondary | ICD-10-CM | POA: Insufficient documentation

## 2015-10-30 DIAGNOSIS — R55 Syncope and collapse: Secondary | ICD-10-CM

## 2015-10-30 DIAGNOSIS — Z8619 Personal history of other infectious and parasitic diseases: Secondary | ICD-10-CM | POA: Insufficient documentation

## 2015-10-30 DIAGNOSIS — Z7951 Long term (current) use of inhaled steroids: Secondary | ICD-10-CM | POA: Insufficient documentation

## 2015-10-30 DIAGNOSIS — Z8742 Personal history of other diseases of the female genital tract: Secondary | ICD-10-CM | POA: Diagnosis not present

## 2015-10-30 DIAGNOSIS — R002 Palpitations: Secondary | ICD-10-CM | POA: Insufficient documentation

## 2015-10-30 DIAGNOSIS — Z87891 Personal history of nicotine dependence: Secondary | ICD-10-CM | POA: Diagnosis not present

## 2015-10-30 DIAGNOSIS — Z79899 Other long term (current) drug therapy: Secondary | ICD-10-CM | POA: Diagnosis not present

## 2015-10-30 NOTE — Telephone Encounter (Signed)
Mr.Ashmead is calling because her PVC's are acting up and she feel lightheaded like she is going to pass out at times . Please call   Thanks

## 2015-10-30 NOTE — ED Notes (Signed)
Pt started on zoloft 50 mg on Saturday.  Pt started to have dizziness, nausea and palpitations on Sunday.  Pt states her symptoms since Sunday.

## 2015-10-30 NOTE — ED Notes (Signed)
Patient stable and ambulatory. Patient verbalizes understanding of discharge instructions and follow-up. 

## 2015-10-30 NOTE — Telephone Encounter (Signed)
Left message to call - uncertain if this is a new/historical patient - no electronic encounters on file w/ our practice & no pending appts.  I do see that pt checked in at [facility] today.  Will await pt callback.

## 2015-10-30 NOTE — ED Notes (Signed)
Patient states her dizziness comes in waves.  She states she feels fluttery in her chest.

## 2015-10-30 NOTE — Telephone Encounter (Signed)
Patient advised.

## 2015-10-30 NOTE — ED Provider Notes (Signed)
CSN: [id number]     Arrival date & time 10/30/15  1332 History   First MD Initiated Contact with Patient 10/30/15 1353     Chief Complaint  Patient presents with  . Dizziness     (Consider location/radiation/quality/duration/timing/severity/associated sxs/prior Treatment) HPI Comments: Patient presents with complaint of near syncopal episode today. Patient states that she was sitting at her desk began feeling a fluttering sensation going up to her neck. She then had several seconds of tunnel vision which then resolved without full syncope. She did not have associated chest pain or shortness of breath. Patient states that over the weekend she has had increased frequency of PVCs. Patient noted that she started Zoloft 50 mg several days ago and has had a total of 3 doses. Pt notes that she has been drinking more coffee recently. No headache. Patient denies signs of stroke including: facial droop, slurred speech, aphasia, weakness/numbness in extremities, imbalance/trouble walking.   Patient is a 50 y.o. female presenting with dizziness. The history is provided by the patient.  Dizziness Associated symptoms: palpitations   Associated symptoms: no chest pain, no nausea, no shortness of breath and no vomiting     Past Medical History  Diagnosis Date  . Endometriosis   . HSV-2 (herpes simplex virus 2) infection   . 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 Inguinal hernia repair      LEFT  . Tonsillectomy    . Bunionectomy      BOTH FEET  . Pelvic laparoscopy      ENDOMETRIOSIS   Family History  Problem Relation Age of Onset  . Cancer Sister     breast  . Breast cancer Maternal Aunt   . Cancer Maternal Aunt     COLON  . Cancer Paternal Uncle     COLON  . Cancer Maternal Grandmother     OVARIAN  . Diabetes Paternal Grandmother   . Cancer Paternal Grandfather     COLON   Social History  Substance Use Topics   . Smoking status: Former Smoker    Types: Cigarettes    Quit date: 01/18/2007  . Smokeless tobacco: Never Used  . Alcohol Use: 0.0 oz/week    0 Standard drinks or equivalent per week     Comment: 1-2 glasses of wine/day   OB History    Gravida Para Term Preterm AB TAB SAB Ectopic Multiple Living   2 1   1  1   1      Review of Systems  Constitutional: Negative for fever and diaphoresis.  Eyes: Negative for redness.  Respiratory: Negative for cough and shortness of breath.   Cardiovascular: Positive for palpitations. Negative for chest pain and leg swelling.  Gastrointestinal: Negative for nausea, vomiting and abdominal pain.  Genitourinary: Negative for dysuria.  Musculoskeletal: Negative for back pain and neck pain.  Skin: Negative for rash.  Neurological: Positive for dizziness. Negative for syncope and light-headedness.  Psychiatric/Behavioral: The patient is not nervous/anxious.     Allergies  Codeine  Home Medications   Prior to Admission medications   Medication Sig Start Date End Date Taking? Authorizing Provider  acyclovir cream (ZOVIRAX) 5 % Apply 1 application topically every 3 (three) hours. 07/12/15   Terrance Mass, MD  calcium carbonate (OS-CAL) 600 MG TABS Take 600 mg by mouth 2 (two) times daily with a meal.      Historical Provider, MD  Cetirizine HCl (ZYRTEC  PO) Take by mouth daily. 1/2 pill daily     Historical Provider, MD  Estradiol 10 MCG TABS vaginal tablet Place 1 tablet (10 mcg total) vaginally 2 (two) times a week. 10/27/15   Terrance Mass, MD  fluconazole (DIFLUCAN) 150 MG tablet Take 1 tablet (150 mg total) by mouth once. 10/12/15   Terrance Mass, MD  fluticasone (FLONASE) 50 MCG/ACT nasal spray Place 2 sprays into both nostrils daily.    Historical Provider, MD  Multiple Vitamin (MULTIVITAMIN) tablet Take 1 tablet by mouth daily.      Historical Provider, MD  sertraline (ZOLOFT) 50 MG tablet Take 1 tablet (50 mg total) by mouth daily. 10/27/15    Terrance Mass, MD  valACYclovir (VALTREX) 1000 MG tablet Take 1 tablet (1,000 mg total) by mouth daily. 10/12/15   Terrance Mass, MD   BP 146/84 mmHg  Pulse 56  Temp(Src) 97.9 F (36.6 C) (Oral)  Resp 16  Ht 5' 6.5" (1.689 m)  SpO2 100%   Physical Exam  Constitutional: She appears well-developed and well-nourished.  HENT:  Head: Normocephalic and atraumatic.  Mouth/Throat: Mucous membranes are normal. Mucous membranes are not dry.  Eyes: Conjunctivae are normal.  Neck: Trachea normal and normal range of motion. Neck supple. Normal carotid pulses and no JVD present. No muscular tenderness present. Carotid bruit is not present. No tracheal deviation present.  Cardiovascular: Normal rate, regular rhythm, S1 normal, S2 normal, normal heart sounds and intact distal pulses.  Exam reveals no decreased pulses.   No murmur heard. Pulmonary/Chest: Effort normal. No respiratory distress. She has no wheezes. She exhibits no tenderness.  Abdominal: Soft. Normal aorta and bowel sounds are normal. There is no tenderness. There is no rebound and no guarding.  Musculoskeletal: Normal range of motion.  Neurological: She is alert.  Skin: Skin is warm and dry. She is not diaphoretic. No cyanosis. No pallor.  Psychiatric: She has a normal mood and affect.  Nursing note and vitals reviewed.   ED Course  Procedures (including critical care time) Labs Review Labs Reviewed - No data to display  Imaging Review No results found. I have personally reviewed and evaluated these images and lab results as part of my medical decision-making.   EKG Interpretation   Date/Time:  Monday October 30 2015 13:35:18 EST Ventricular Rate:  57 PR Interval:  140 QRS Duration: 86 QT Interval:  398 QTC Calculation: 387 R Axis:   78 Text Interpretation:  Sinus bradycardia Otherwise normal ECG No  significant change was found Confirmed by CAMPOS  MD, Lennette Bihari ([id number]) on  10/30/2015 2:07:28 PM       2:43 PM  Patient seen and examined. EKG reviewed.   Vital signs reviewed and are as follows: BP 120/71 mmHg  Pulse 55  Temp(Src) 97.9 F (36.6 C) (Oral)  Resp 16  Ht 5' 6.5" (1.689 m)  SpO2 100%  Workup reassuring. Patient informed. Encouraged PCP follow-up to discuss risks and benefits of Zoloft. Offered temporary benzo, patient declines. Return to the emergency department with full syncope, chest pain, shortness breath, new or changing symptoms. Patient and husband verbalized understanding and agree with plan.  MDM   Final diagnoses:  Near syncope   Patient with near syncope. I suspect this is related to recent initiation of SSRI. No obvious concerning medication interactions. EKG does not show any signs of arrhythmia or risk factors for the same. She is not orthostatic. Patient appears well, nontoxic. Otherwise normal physical exam. No concerning  family history. Encouraged PCP follow-up as above.    Carlisle Cater, PA-C 10/31/15 [place], MD 11/01/15 [phone]

## 2015-10-30 NOTE — Discharge Instructions (Signed)
Please read and follow all provided instructions.  Your diagnoses today include:  1. Near syncope     Tests performed today include:  An EKG of your heart  Vital signs. See below for your results today.   Medications prescribed:   None  Take any prescribed medications only as directed.  Follow-up instructions: Please follow-up with your primary care provider as soon as you can for further evaluation of your symptoms.   Return instructions:  SEEK IMMEDIATE MEDICAL ATTENTION IF:  You develop chest pain or shortness of breath.  You pass out completely or have persistent dizziness.  You have chest pain not typical of your usual pain for which you originally saw your caregiver.   You have any other emergent concerns regarding your health.  Your vital signs today were: BP 139/84 mmHg   Pulse 60   Temp(Src) 97.9 F (36.6 C) (Oral)   Resp 16   Ht 5' 6.5" (1.689 m)   SpO2 99% If your blood pressure (BP) was elevated above 135/85 this visit, please have this repeated by your doctor within one month. --------------

## 2015-10-30 NOTE — Telephone Encounter (Signed)
Patient said she saw spoke with you Friday and you prescribed Zoloft 50 mg. She said she took it and started feeling jittery and like going to pass out. Went to ER. Her exam was okay and PA there told her it could be the Zoloft but they thought a lot of it was anxiety related. She said that was one episode and no more of that. She was nauseated Sat and 02/10/2023 but that has passed today. The PA told her okay to continue taking it and symptoms would probably even out after a week or so.  She feels fine today but wanted to check with you to see it you wanted her to continue with it. She wanted to "be sure these symptoms aren't going to be cumulative".

## 2015-10-30 NOTE — Telephone Encounter (Signed)
Have her do the following: First week half a tablet every other day Second week half a tablet every day Third week take 1 full tablet daily

## 2016-02-16 ENCOUNTER — Other Ambulatory Visit: Payer: Self-pay | Admitting: Gynecology

## 2016-03-11 ENCOUNTER — Ambulatory Visit (INDEPENDENT_AMBULATORY_CARE_PROVIDER_SITE_OTHER): Payer: 59 | Admitting: Family Medicine

## 2016-03-11 ENCOUNTER — Encounter: Payer: Self-pay | Admitting: Family Medicine

## 2016-03-11 VITALS — BP 128/85 | HR 58 | Ht 67.0 in | Wt 148.0 lb

## 2016-03-11 DIAGNOSIS — M25552 Pain in left hip: Secondary | ICD-10-CM

## 2016-03-11 NOTE — Patient Instructions (Signed)
Your ultrasound shows an abnormality only of your iliopsoas - consistent with a partial tear though most of the muscle is intact. A fascial herniation can sometimes have this appearance (where the muscle pushes through the fibrous sheath covering it) but I've never seen this of the iliopsoas. You do not have a hernia (inguinal, femoral, incisional) that would potentially require surgery. You have some secondary gluteus medius strain as well. Start physical therapy and do home exercises on days you don't go to therapy. Icing 15 minutes at a time 3-4 times a day. I would avoid massage, any significant pressure to this area at least for the next few weeks. Aleve 2 tabs twice a day with food - can take 7-10 days then as needed. Compression shorts may help but these spasms you're getting secondary to the partial tear/strain are fairly deep - measured at 3cm. Follow up with me in 1 month to 6 weeks for reevaluation.

## 2016-03-12 NOTE — Progress Notes (Signed)
PCP: Ander Purpura, MD  Subjective:   HPI: Patient is a 50 y.o. female here for left hip pain.  Patient reports she's had 3 weeks of left anterior hip pain. Has history of hernia repair on this side but it feels differently and more inferior. No prior injuries. Associated swelling. Had some soreness after using elliptical at gym radiating from front of left hip to posterior. Some radiation down leg. Initially seeing chiropractor helped with this along with massage. But now pressing seems to make the pain worse. Pain is 1/10, sharp. No skin changes, numbness. No bowel/bladder dysfunction.  Past Medical History  Diagnosis Date  . Endometriosis   . HSV-2 (herpes simplex virus 2) infection   . 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 calcium carbonate (OS-CAL) 600 MG TABS Take 600 mg by mouth 2 (two) times daily with a meal.      . Cetirizine HCl (ZYRTEC PO) Take by mouth daily. 1/2 pill daily     . Estradiol 10 MCG TABS vaginal tablet Place 1 tablet (10 mcg total) vaginally 2 (two) times a week. 8 tablet 11  . fluticasone (FLONASE) 50 MCG/ACT nasal spray Place 2 sprays into both nostrils daily.    . Multiple Vitamin (MULTIVITAMIN) tablet Take 1 tablet by mouth daily.      . sertraline (ZOLOFT) 50 MG tablet Take 1 tablet (50 mg total) by mouth daily. 30 tablet 11  . valACYclovir (VALTREX) 1000 MG tablet TAKE 1 TABLET BY MOUTH DAILY 30 tablet 8   No current facility-administered medications on file prior to visit.    Past Surgical History  Procedure Laterality Date  . Inguinal hernia repair      LEFT  . Tonsillectomy    . Bunionectomy      BOTH FEET  . Pelvic laparoscopy      ENDOMETRIOSIS    Allergies  Allergen Reactions  . Oxycodone-Acetaminophen Nausea And Vomiting  . Propoxyphene Nausea And Vomiting  . Codeine Nausea Only  . Diclofenac Nausea Only     Social History   Social History  . Marital Status: Divorced    Spouse Name: N/A  . Number of Children: N/A  . Years of Education: N/A   Occupational History  . Not on file.   Social History Main Topics  . Smoking status: Former Smoker    Types: Cigarettes    Quit date: 01/18/2007  . Smokeless tobacco: Never Used  . Alcohol Use: 0.0 oz/week    0 Standard drinks or equivalent per week     Comment: 1-2 glasses of wine/day  . Drug Use: No  . Sexual Activity: No   Other Topics Concern  . Not on file   Social History Narrative    Family History  Problem Relation Age of Onset  . Cancer Sister     breast  . Breast cancer Maternal Aunt   . Cancer Maternal Aunt     COLON  . Cancer Paternal Uncle     COLON  . Cancer Maternal Grandmother     OVARIAN  . Diabetes Paternal Grandmother   . Cancer Paternal Grandfather     COLON    BP 128/85 mmHg  Pulse 58  Ht 5\' 7"  (1.702 m)  Wt 148 lb (67.132 kg)  BMI 23.17 kg/m2  Review of Systems: See HPI above.    Objective:  Physical Exam:  Gen:  NAD, comfortable in exam room  Back/left hip: No gross deformity, scoliosis. TTP focally anterior hip inferior to inguinal ligament in area of hip flexor, proximal quad.  No midline or bony TTP.  No back, trochanter tenderness.  Minimal tenderness posteriorly in gluteal musculature. FROM Strength LEs 5/5 all muscle groups.   2+ MSRs in patellar and achilles tendons, equal bilaterally. Negative SLRs. Sensation intact to light touch bilaterally. Negative logroll bilateral hips Negative fabers and piriformis stretches.  MSK u/s left hip:  No evidence femoral or inguinal hernia with valsalva.  In area of maximal pain and tenderness deep she has an abnormality of iliopsoas consistent with partial tear, ? Fascial herniation though would be unusual in this location - not present on the opposite side.  No cortical irregularity of femur or hip.  No other abnormalities visuslized.     Assessment & Plan:  1. Left hip pain - consistent with partial tear of iliopsoas though bulk of muscle can be seen inserting on lesser trochanter.  Reassured patient.  Strength is well preserved grossly.  Advised we start with physical therapy, icing, nsaids.  Consider compression shorts.  F/u in 1 month to 6 weeks.  Advised to avoid massage to area as this has led to more discomfort.  Also avoid elliptical, other painful activities for now but will work these back into her routine in physical therapy.

## 2016-03-13 DIAGNOSIS — M25552 Pain in left hip: Secondary | ICD-10-CM | POA: Insufficient documentation

## 2016-03-13 NOTE — Assessment & Plan Note (Signed)
consistent with partial tear of iliopsoas though bulk of muscle can be seen inserting on lesser trochanter.  Reassured patient.  Strength is well preserved grossly.  Advised we start with physical therapy, icing, nsaids.  Consider compression shorts.  F/u in 1 month to 6 weeks.  Advised to avoid massage to area as this has led to more discomfort.  Also avoid elliptical, other painful activities for now but will work these back into her routine in physical therapy.

## 2016-03-14 ENCOUNTER — Ambulatory Visit (INDEPENDENT_AMBULATORY_CARE_PROVIDER_SITE_OTHER): Payer: 59 | Admitting: Rehabilitative and Restorative Service Providers"

## 2016-03-14 ENCOUNTER — Encounter: Payer: Self-pay | Admitting: Rehabilitative and Restorative Service Providers"

## 2016-03-14 DIAGNOSIS — M79605 Pain in left leg: Secondary | ICD-10-CM

## 2016-03-14 DIAGNOSIS — R29898 Other symptoms and signs involving the musculoskeletal system: Secondary | ICD-10-CM | POA: Diagnosis not present

## 2016-03-14 DIAGNOSIS — M791 Myalgia, unspecified site: Secondary | ICD-10-CM

## 2016-03-14 NOTE — Patient Instructions (Signed)
Self massage using ~4 inch rubber ball - working through hip back and front - can do lying down or standing   Piriformis Stretch    Lying on back, pull right knee toward opposite shoulder. Hold __20-30__ seconds. Repeat __3__ times. Do __2-3__ sessions per day.    Quads / HF, Supine    Lie near edge of bed, pull both knees toward chest; hold right knee to chest and lower left leg off the edge of the bed. Keep leg relaxed, can bend hanging knee backward   Hold _30 __ seconds.  Repeat __3 _ times per session. Do 2-3___ sessions per day.   TENS UNIT: This is helpful for muscle pain and spasm.   Search and Purchase a TENS 7000 2nd edition at www.tenspros.com. It should be less than $30.     TENS unit instructions: Do not shower or bathe with the unit on Turn the unit off before removing electrodes or batteries If the electrodes lose stickiness add a drop of water to the electrodes after they are disconnected from the unit and place on plastic sheet. If you continued to have difficulty, call the TENS unit company to purchase more electrodes. Do not apply lotion on the skin area prior to use. Make sure the skin is clean and dry as this will help prolong the life of the electrodes. After use, always check skin for unusual red areas, rash or other skin difficulties. If there are any skin problems, does not apply electrodes to the same area. Never remove the electrodes from the unit by pulling the wires. Do not use the TENS unit or electrodes other than as directed. Do not change electrode placement without consultating your therapist or physician. Keep 2 fingers with between each electrode.   Sleeping on Back  Place pillow under knees. A pillow with cervical support and a roll around waist are also helpful. Copyright  VHI. All rights reserved.  Sleeping on Side Place pillow between knees. Use cervical support under neck and a roll around waist as needed. Copyright  VHI. All  rights reserved.   Sleeping on Stomach   If this is the only desirable sleeping position, place pillow under lower legs, and under stomach or chest as needed.  Posture - Sitting   Sit upright, head facing forward. Try using a roll to support lower back. Keep shoulders relaxed, and avoid rounded back. Keep hips level with knees. Avoid crossing legs for long periods. Stand to Sit / Sit to Stand   To sit: Bend knees to lower self onto front edge of chair, then scoot back on seat. To stand: Reverse sequence by placing one foot forward, and scoot to front of seat. Use rocking motion to stand up.   Work Height and Reach  Ideal work height is no more than 2 to 4 inches below elbow level when standing, and at elbow level when sitting. Reaching should be limited to arm's length, with elbows slightly bent.  Bending  Bend at hips and knees, not back. Keep feet shoulder-width apart.    Posture - Standing   Good posture is important. Avoid slouching and forward head thrust. Maintain curve in low back and align ears over shoul- ders, hips over ankles.  Alternating Positions   Alternate tasks and change positions frequently to reduce fatigue and muscle tension. Take rest breaks. Computer Work   Position work to Programmer, multimedia. Use proper work and seat height. Keep shoulders back and down, wrists straight, and elbows at  right angles. Use chair that provides full back support. Add footrest and lumbar roll as needed.  Getting Into / Out of Car  Lower self onto seat, scoot back, then bring in one leg at a time. Reverse sequence to get out.  Dressing  Lie on back to pull socks or slacks over feet, or sit and bend leg while keeping back straight.    Housework - Sink  Place one foot on ledge of cabinet under sink when standing at sink for prolonged periods.   Pushing / Pulling  Pushing is preferable to pulling. Keep back in proper alignment, and use leg muscles to do the work.  Deep  Squat   Squat and lift with both arms held against upper trunk. Tighten stomach muscles without holding breath. Use smooth movements to avoid jerking.  Avoid Twisting   Avoid twisting or bending back. Pivot around using foot movements, and bend at knees if needed when reaching for articles.  Carrying Luggage   Distribute weight evenly on both sides. Use a cart whenever possible. Do not twist trunk. Move body as a unit.   Lifting Principles .Maintain proper posture and head alignment. .Slide object as close as possible before lifting. .Move obstacles out of the way. .Test before lifting; ask for help if too heavy. .Tighten stomach muscles without holding breath. .Use smooth movements; do not jerk. .Use legs to do the work, and pivot with feet. .Distribute the work load symmetrically and close to the center of trunk. .Push instead of pull whenever possible.   Ask For Help   Ask for help and delegate to others when possible. Coordinate your movements when lifting together, and maintain the low back curve.  Log Roll   Lying on back, bend left knee and place left arm across chest. Roll all in one movement to the right. Reverse to roll to the left. Always move as one unit. Housework - Sweeping  Use long-handled equipment to avoid stooping.   Housework - Wiping  Position yourself as close as possible to reach work surface. Avoid straining your back.  Laundry - Unloading Wash   To unload small items at bottom of washer, lift leg opposite to arm being used to reach.  [place] close to area to be raked. Use arm movements to do the work. Keep back straight and avoid twisting.     Cart  When reaching into cart with one arm, lift opposite leg to keep back straight.   Getting Into / Out of Bed  Lower self to lie down on one side by raising legs and lowering head at the same time. Use arms to assist moving without twisting. Bend both knees to roll onto  back if desired. To sit up, start from lying on side, and use same move-ments in reverse. Housework - Vacuuming  Hold the vacuum with arm held at side. Step back and forth to move it, keeping head up. Avoid twisting.   Laundry - IT consultant so that bending and twisting can be avoided.   Laundry - Unloading Dryer  Squat down to reach into clothes dryer or use a reacher.  Gardening - Weeding / Probation officer or Kneel. Knee pads may be helpful.

## 2016-03-14 NOTE — Therapy (Signed)
[facility] [place], [place], [address] Phone: [phone]   Fax:  [phone]  Physical Therapy Evaluation  Patient Details  Name: Jessica [name] MRN: [id number] Date of Birth: 06/01/1966 Referring Provider: Dr Barbaraann Barthel   Encounter Date: 03/14/2016      PT End of Session - 03/14/16 1101    Visit Number 1   Number of Visits 12   Date for PT Re-Evaluation 04/25/16   PT Start Time 1112   PT Stop Time 1210   PT Time Calculation (min) 58 min   Activity Tolerance Patient tolerated treatment well      Past Medical History  Diagnosis Date  . Endometriosis   . HSV-2 (herpes simplex virus 2) infection   . Hypercoagulability due to prothrombin II mutation [facility])     Past Surgical History  Procedure Laterality Date  . Inguinal hernia repair      LEFT  . Tonsillectomy    . Bunionectomy      BOTH FEET  . Pelvic laparoscopy      ENDOMETRIOSIS    There were no vitals filed for this visit.       Subjective Assessment - 03/14/16 1112    Subjective Patient reports that she started having Lt hip pain a couple of months ago. She noticed pain when sitting at desk 1-2 hours with shearing pain in the hip. She was seen by chiropractor and massage therapist with some improvement.  Was on the eliptical  last week and noted significant increase in pain.    Pertinent History hernia repair Lt ~ 20 yrs; foot surgery x 2 yrs past  - lengthening achilles tendon ~ 2 yrs ago (Lt leg feels "weak")   How long can you sit comfortably? 1-2 hours   How long can you stand comfortably? 20-30 min    How long can you walk comfortably? 15-20 min - sometimes longer    Diagnostic tests [place] - possible tear of iliopsoas and irritation of glut med per MD    Patient Stated Goals get better    Currently in Pain? Yes   Pain Score 1    Pain Location Hip   Pain Orientation Left   Pain Descriptors / Indicators Nagging;Sharp   Pain Type  Acute pain   Pain Radiating Towards radiating into the medial/lateral thigh at times    Pain Onset More than a month ago   Pain Frequency Intermittent   Aggravating Factors  sitting; walking fast; eliptical; stairs    Pain Relieving Factors chiropractor care; massage; ice            [facility] PT Assessment - 03/14/16 0001    Assessment   Medical Diagnosis Lt hip pain    Referring Provider Dr Barbaraann Barthel    Onset Date/Surgical Date 01/03/16   Hand Dominance Right   Next MD Visit no return scheduled    Prior Therapy chiropractic care and massage   Precautions   Precautions None   Balance Screen   Has the patient fallen in the past 6 months Yes   How many times? 1  fell back - soreness    Has the patient had a decrease in activity level because of a fear of falling?  No   Is the patient reluctant to leave their home because of a fear of falling?  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    Leisure houshold chores; walking dogs 2 times/wk 1/2 to 1 mile; gym eliptical 1 mile machines - 1 - 1.5 hours 3-4 times/wk not in the past 2 months (less frequently)    Observation/Other Assessments   Focus on Therapeutic Outcomes (FOTO)  30% limitation    Sensation   Additional Comments abnormal sensation in the Lt foot from previous surgeries    Posture/Postural Control   Posture Comments sits with forward flexed posture Lt LE crossed over Rt ; stanidng - head forward shoulcers rounded and elevated equal boney landmarks through hips and pelvis    AROM   Overall AROM Comments trunk and bilat LE's - WFL's    Right/Left Hip --  discomfort with full hip flex & ext Lt    Strength   Overall Strength Comments WNL's bilat LE's except Lt hip flexion 4+/5 and painful    Flexibility   Hamstrings tight Rt ~  85 deg; lt 80 deg    Quadriceps prone knee flex tight Lt    ITB WFL's   Piriformis tight Lt with pulling and discomfort in the Lt groin    Palpation   Spinal mobility WNL's lumbar spine    SI assessment  WNL's    Palpation comment significant tenderness and tightness through the Lt groin/inguinal ligament area; iliopsoas; piriformis; glut medius   Functional Gait  Assessment   Gait assessed  --  gait WFL's                    [facility] Adult PT Treatment/Exercise - 03/14/16 0001    Therapeutic Activites    Therapeutic Activities --  myofacial release using ~ 4 inch rubber ball hip flexors/abd   Knee/Hip Exercises: Stretches   Hip Flexor Stretch 3 reps;30 seconds   Piriformis Stretch 3 reps;30 seconds   Moist Heat Therapy   Number Minutes Moist Heat 15 Minutes   Moist Heat Location Hip  anterior and posterior    Electrical Stimulation   Electrical Stimulation Location psoas/hip flexors/hip abductors Lt    Electrical Stimulation Action IFC   Electrical Stimulation Parameters to tolerance   Electrical Stimulation Goals Pain;Tone                PT Education - 03/14/16 1203    Education provided Yes   Education Details HEP TENs myofacial release work    Forensic psychologist) Educated Patient   Methods Explanation;Demonstration;Tactile cues;Verbal cues;Handout   Comprehension Verbalized understanding;Returned demonstration;Verbal cues required;Tactile cues required             PT Long Term Goals - 03/14/16 1300    PT LONG TERM GOAL #1   Title Improve tissue extensibility through Lt hip flexors/abductors allowing palpation to tissue with minimal tenderness or tightness 04/25/16   Time 6   Period Weeks   Status New   PT LONG TERM GOAL #2   Title Pain free resisted hip strength 5/5 04/25/16   Time 6   Period Weeks   Status New   PT LONG TERM GOAL #3   Title Patient reports ability to sit, walk and return to gym program for 45-60 min without increase in symptoms 04/25/16    Time 6   Period Weeks   Status New   PT LONG TERM GOAL #4   Title Independent in HEP  04/25/16   Time 6   Status  New   PT LONG TERM GOAL #5   Title Improve FOTO to </= 22% limitation 04/24/16   Time 6   Period Weeks   Status New               Plan - 03/14/16 1255    Clinical Impression Statement Gabrianna presents with 2 month history of Lt hip pain and dysfunction. She has pain with activities including sitting; walking and eliptical; muscular tenderness and tightness to palpation through the iliopsoas, priiformis and gluteus medius.    Rehab Potential Good   PT Frequency 2x / week   PT Duration 6 weeks   PT Treatment/Interventions Patient/family education;ADLs/Self Care Home Management;Neuromuscular re-education;Cryotherapy;Electrical Stimulation;Iontophoresis 4mg /ml Dexamethasone;Moist Heat;Ultrasound;Manual techniques;Dry needling;Therapeutic activities;Therapeutic exercise   PT Next Visit Plan manual work through iliopsoas; manual vs TDN hip musculature; core stabilization; stretching; activie exercise; modalities    Consulted and Agree with Plan of Care Patient      Patient will benefit from skilled therapeutic intervention in order to improve the following deficits and impairments:  Postural dysfunction, Improper body mechanics, Pain, Decreased range of motion, Decreased strength, Decreased mobility, Increased fascial restricitons, Increased muscle spasms, Decreased endurance, Decreased activity tolerance  Visit Diagnosis: Left leg pain - Plan: PT plan of care cert/re-cert  Myalgia - Plan: PT plan of care cert/re-cert  Other symptoms and signs involving the musculoskeletal system - Plan: PT plan of care cert/re-cert     Problem List Patient Active Problem List   Diagnosis Date Noted  . Left hip pain 03/13/2016  . Multiple thyroid nodules 10/07/2014  . Hypercoagulability due to prothrombin II mutation ([place]) 10/07/2014  . Blood clotting disorder ([place]) 12/27/2013  .  Family history of clotting disorder 12/21/2013  . Perimenopause 12/21/2013  . HSV-2 (herpes simplex virus 2) infection     Sair Faulcon Nilda Simmer PT, MPH  03/14/2016, 1:10 PM  [facility] [place], [place], [address] Phone: [phone]   Fax:  [phone]  Name: MELLANY SCHLENDER MRN: [id number] Date of Birth: Apr 12, 1966

## 2016-03-20 ENCOUNTER — Ambulatory Visit (INDEPENDENT_AMBULATORY_CARE_PROVIDER_SITE_OTHER): Payer: 59 | Admitting: Physical Therapy

## 2016-03-20 DIAGNOSIS — R29898 Other symptoms and signs involving the musculoskeletal system: Secondary | ICD-10-CM | POA: Diagnosis not present

## 2016-03-20 DIAGNOSIS — M79605 Pain in left leg: Secondary | ICD-10-CM | POA: Diagnosis not present

## 2016-03-20 DIAGNOSIS — M791 Myalgia, unspecified site: Secondary | ICD-10-CM

## 2016-03-20 NOTE — Therapy (Signed)
[facility] [place] [address] [place], [place], [address] Phone: [phone]   Fax:  [phone]  Physical Therapy Treatment  Patient Details  Name: Jessica [name] MRN: [id number] Date of Birth: 1966/06/22 Referring Provider: Dr Pearletha Forge   Encounter Date: 03/20/2016      PT End of Session - 03/20/16 1218    Visit Number 2   Number of Visits 12   Date for PT Re-Evaluation 04/25/16   PT Start Time 1106   PT Stop Time 1156   PT Time Calculation (min) 50 min   Activity Tolerance Patient tolerated treatment well      Past Medical History  Diagnosis Date  . Endometriosis   . HSV-2 (herpes simplex virus 2) infection   . Hypercoagulability due to prothrombin II mutation [facility])     Past Surgical History  Procedure Laterality Date  . Inguinal hernia repair      LEFT  . Tonsillectomy    . Bunionectomy      BOTH FEET  . Pelvic laparoscopy      ENDOMETRIOSIS    There were no vitals filed for this visit.      Subjective Assessment - 03/20/16 1113    Subjective Pt reports she is feeling better than last week.  Still holding off on cardio at gym.  Continues to have Lt hip pain if sitting too long or when she first wakes.     Diagnostic tests [place] - possible tear of iliopsoas and irritation of glut med per MD    Currently in Pain? Yes   Pain Score 1    Pain Location Hip   Pain Orientation Left   Pain Descriptors / Indicators Dull   Aggravating Factors  sitting too long   Pain Relieving Factors massage, chiropractic care.           [facility] Adult PT Treatment/Exercise - 03/20/16 0001    Knee/Hip Exercises: Stretches   Hip Flexor Stretch 3 reps;30 seconds;Left   Piriformis Stretch 3 reps;30 seconds   Other Knee/Hip Stretches Trial of ITB stretch - increased pain in Lt hip flexor, stopped.    Knee/Hip Exercises: Aerobic   Nustep L5: 5 min    Knee/Hip Exercises: Supine   Single Leg Bridge Right;Left;5 reps   Other  Supine Knee/Hip Exercises Hip flexion isometric x 5 sec hold x 5 reps (shown ipsilateral and contralateral for HEP)    Other Supine Knee/Hip Exercises Pt demonstrated side plank and side crunches to assess form.  Pt encouraged to hold off on all abdominal crunches (front / side) at this time.    Modalities   Modalities Electrical Stimulation;Ultrasound   Programme researcher, broadcasting/film/video Location Lt glute (piriformis area)    Ship broker Stimulation Parameters to tolerance    Electrical Stimulation Goals Pain;Tone   Ultrasound   Ultrasound Location Lt glute (piriformis area   Ultrasound Parameters 1.0 w/cm2, 1.0 mHz, 100%, 8 min    Ultrasound Goals Pain   Manual Therapy   Manual Therapy Soft tissue mobilization   Soft tissue mobilization to Lt iliopsoas (point tender to touch), Lt hip rotators with deep pressure and with contract/ relax.                       PT Long Term Goals - 03/20/16 1223    PT LONG TERM GOAL #1   Title Improve tissue extensibility through Lt hip flexors/abductors allowing palpation  to tissue with minimal tenderness or tightness 04/25/16   Time 6   Period Weeks   Status On-going   PT LONG TERM GOAL #2   Title Pain free resisted hip strength 5/5 04/25/16   Time 6   Period Weeks   Status On-going   PT LONG TERM GOAL #3   Title Patient reports ability to sit, walk and return to gym program for 45-60 min without increase in symptoms 04/25/16   Time 6   Period Weeks   Status On-going   PT LONG TERM GOAL #4   Title Independent in HEP  04/25/16   Time 6   Period Weeks   Status On-going   PT LONG TERM GOAL #5   Title Improve FOTO to </= 22% limitation 04/24/16   Time 6   Period Weeks   Status On-going               Plan - 03/20/16 1218    Clinical Impression Statement Pt continues to be point tender to touch in Lt ant/ posterior hip.  Pt reported some increased discomfort with exercise,  reduced with rest and combo [place].  Pt anxious to return to previous level of activity (gym workouts) but was encouraged to avoid the vigorous hip flexion exercises at this time until pain is reduced.  No goals met yet; only 2nd visit.     Rehab Potential Good   PT Frequency 2x / week   PT Duration 6 weeks   PT Treatment/Interventions Patient/family education;ADLs/Self Care Home Management;Neuromuscular re-education;Cryotherapy;Electrical Stimulation;Iontophoresis '4mg'$ /ml Dexamethasone;Moist Heat;Ultrasound;Manual techniques;Dry needling;Therapeutic activities;Therapeutic exercise   PT Next Visit Plan manual work through iliopsoas; manual vs TDN hip musculature; core stabilization; stretching; activie exercise; modalities as indicated.    Consulted and Agree with Plan of Care Patient      Patient will benefit from skilled therapeutic intervention in order to improve the following deficits and impairments:  Postural dysfunction, Improper body mechanics, Pain, Decreased range of motion, Decreased strength, Decreased mobility, Increased fascial restricitons, Increased muscle spasms, Decreased endurance, Decreased activity tolerance  Visit Diagnosis: Left leg pain  Myalgia  Other symptoms and signs involving the musculoskeletal system     Problem List Patient Active Problem List   Diagnosis Date Noted  . Left hip pain 03/13/2016  . Multiple thyroid nodules 10/07/2014  . Hypercoagulability due to prothrombin II mutation ([place]) 10/07/2014  . Blood clotting disorder ([place]) 12/27/2013  . Family history of clotting disorder 12/21/2013  . Perimenopause 12/21/2013  . HSV-2 (herpes simplex virus 2) infection    Kerin Perna, PTA 03/20/2016 12:24 PM  [facility] [place], [place], [address] Phone: [phone]   Fax:  [phone]  Name: Jessica [name] MRN: [id number] Date of Birth: April 11, 1966

## 2016-03-21 ENCOUNTER — Ambulatory Visit: Payer: 59 | Admitting: Rehabilitative and Restorative Service Providers"

## 2016-03-22 ENCOUNTER — Encounter: Payer: 59 | Admitting: Physical Therapy

## 2016-03-27 ENCOUNTER — Ambulatory Visit (INDEPENDENT_AMBULATORY_CARE_PROVIDER_SITE_OTHER): Payer: 59 | Admitting: Rehabilitative and Restorative Service Providers"

## 2016-03-27 ENCOUNTER — Encounter: Payer: Self-pay | Admitting: Rehabilitative and Restorative Service Providers"

## 2016-03-27 ENCOUNTER — Encounter: Payer: 59 | Admitting: Rehabilitative and Restorative Service Providers"

## 2016-03-27 DIAGNOSIS — M791 Myalgia, unspecified site: Secondary | ICD-10-CM

## 2016-03-27 DIAGNOSIS — M79605 Pain in left leg: Secondary | ICD-10-CM

## 2016-03-27 DIAGNOSIS — R29898 Other symptoms and signs involving the musculoskeletal system: Secondary | ICD-10-CM | POA: Diagnosis not present

## 2016-03-27 NOTE — Therapy (Signed)
[place], [place], [address] Phone: [phone]   Fax:  [phone]  Physical Therapy Treatment  Patient Details  Name: Jessica [name] MRN: [id number] Date of Birth: 02/18/1966 Referring Provider: Dr Barbaraann Barthel   Encounter Date: 03/27/2016      PT End of Session - 03/27/16 1106    Visit Number 3   Number of Visits 12   Date for PT Re-Evaluation 04/25/16   PT Start Time 1104   PT Stop Time 1157   PT Time Calculation (min) 53 min   Activity Tolerance Patient tolerated treatment well      Past Medical History  Diagnosis Date  . Endometriosis   . HSV-2 (herpes simplex virus 2) infection   . Hypercoagulability due to prothrombin II mutation [facility])     Past Surgical History  Procedure Laterality Date  . Inguinal hernia repair      LEFT  . Tonsillectomy    . Bunionectomy      BOTH FEET  . Pelvic laparoscopy      ENDOMETRIOSIS    There were no vitals filed for this visit.      Subjective Assessment - 03/27/16 1107    Subjective Was feelin g much better - had increased pain after the exercises last visit. Sat at work for almost all day earlier this week and could really tell that things tightened up.    Currently in Pain? Yes   Pain Score 3    Pain Location Hip   Pain Orientation Left  back/front/side    Pain Descriptors / Indicators Dull   Pain Type Acute pain   Pain Onset More than a month ago   Pain Frequency Intermittent   Aggravating Factors  sitting too long    Pain Relieving Factors massage stretching                          [facility] Adult PT Treatment/Exercise - 03/27/16 0001    Knee/Hip Exercises: Stretches   Hip Flexor Stretch 3 reps;30 seconds;Left   Piriformis Stretch 3 reps;30 seconds   Knee/Hip Exercises: Standing   Hip Abduction Right;Left;2 sets;10 reps   Hip Extension Right;Left;2 sets;10 reps   Moist Heat Therapy   Number Minutes Moist Heat 18  Minutes   Moist Heat Location Hip;Lumbar Spine  anterior/posterior    Electrical Stimulation   Electrical Stimulation Location Lt glute (piriformis area)/anterior hip/psoas   Electrical Stimulation Action IFC   Electrical Stimulation Parameters to tolerance   Electrical Stimulation Goals Pain;Tone   Manual Therapy   Manual Therapy Soft tissue mobilization   Soft tissue mobilization Lt piriformis/glut medius; iliopsoas musculature           Trigger Point Dry Needling - 03/27/16 1128    Consent Given? Yes   Education Handout Provided Yes   Muscles Treated Lower Body --  Lt   Gluteus Maximus Response Twitch response elicited;Palpable increased muscle length   Gluteus Minimus Response Twitch response elicited;Palpable increased muscle length   Piriformis Response Twitch response elicited;Palpable increased muscle length              PT Education - 03/27/16 1119    Education provided Yes   Education Details HEP; TDN    Person(s) Educated Patient   Methods Explanation;Demonstration;Tactile cues;Verbal cues;Handout   Comprehension Verbalized understanding;Returned demonstration;Verbal cues required;Tactile cues required             PT Long Term  Goals - 03/27/16 1110    PT LONG TERM GOAL #1   Title Improve tissue extensibility through Lt hip flexors/abductors allowing palpation to tissue with minimal tenderness or tightness 04/25/16   Time 6   Period Weeks   Status On-going   PT LONG TERM GOAL #2   Title Pain free resisted hip strength 5/5 04/25/16   Time 6   Period Weeks   Status On-going   PT LONG TERM GOAL #3   Title Patient reports ability to sit, walk and return to gym program for 45-60 min without increase in symptoms 04/25/16   Time 6   Period Weeks   Status On-going   PT LONG TERM GOAL #4   Title Independent in HEP  04/25/16   Time 6   Period Weeks   Status On-going   PT LONG TERM GOAL #5   Title Improve FOTO to </= 22% limitation 04/24/16   Time 6    Period Weeks   Status On-going               Plan - 03/27/16 1150    Clinical Impression Statement Flare up following last visit - some of the exercises she tried in the clinc and at home made the hip tighter and more painful - additionally, she sat more at work without getting up to take a break. Continued tightness noted through Lt hip abductors/rotators and hip flexors - iliopsoas musculature. Trial of TDN with some decrease in muscular tightnes noted. Advised patient to continue to limit hip flexed exercises and take frewquent breaks at work to stand for several seconds.    Rehab Potential Good   PT Frequency 2x / week   PT Duration 6 weeks   PT Treatment/Interventions Patient/family education;ADLs/Self Care Home Management;Neuromuscular re-education;Cryotherapy;Electrical Stimulation;Iontophoresis 4mg /ml Dexamethasone;Moist Heat;Ultrasound;Manual techniques;Dry needling;Therapeutic activities;Therapeutic exercise   PT Next Visit Plan manual work through iliopsoas; manual vs TDN hip musculature; core stabilization; stretching; activie exercise; modalities as indicated. Assess response to TDN    Consulted and Agree with Plan of Care Patient      Patient will benefit from skilled therapeutic intervention in order to improve the following deficits and impairments:  Postural dysfunction, Improper body mechanics, Pain, Decreased range of motion, Decreased strength, Decreased mobility, Increased fascial restricitons, Increased muscle spasms, Decreased endurance, Decreased activity tolerance  Visit Diagnosis: Left leg pain  Myalgia  Other symptoms and signs involving the musculoskeletal system     Problem List Patient Active Problem List   Diagnosis Date Noted  . Left hip pain 03/13/2016  . Multiple thyroid nodules 10/07/2014  . Hypercoagulability due to prothrombin II mutation ([place]) 10/07/2014  . Blood clotting disorder ([place]) 12/27/2013  . Family history of clotting disorder  12/21/2013  . Perimenopause 12/21/2013  . HSV-2 (herpes simplex virus 2) infection     Karron Goens Nilda Simmer PT, MPH  03/27/2016, 11:55 AM  [facility] [place], [place], [address] Phone: [phone]   Fax:  [phone]  Name: Jessica [name] MRN: [id number] Date of Birth: 09-24-1966

## 2016-03-27 NOTE — Patient Instructions (Signed)
Strengthening: Hip Extension - Resisted    With tubing around right ankle, face anchor and pull leg straight back. Repeat _10___ times per set. Do __2-3__ sets per session. Do _2-3___ sessions per day.    Strengthening: Hip Abduction - Resisted    With tubing around right leg, other side toward anchor, extend leg out from side. Repeat _10__ times per set. Do _2-3___ sets per session. Do __2-3__ sessions per day.   Trigger Point Dry Needling  . What is Trigger Point Dry Needling (DN)? o DN is a physical therapy technique used to treat muscle pain and dysfunction. Specifically, DN helps deactivate muscle trigger points (muscle knots).  o A thin filiform needle is used to penetrate the skin and stimulate the underlying trigger point. The goal is for a local twitch response (LTR) to occur and for the trigger point to relax. No medication of any kind is injected during the procedure.   . What Does Trigger Point Dry Needling Feel Like?  o The procedure feels different for each individual patient. Some patients report that they do not actually feel the needle enter the skin and overall the process is not painful. Very mild bleeding may occur. However, many patients feel a deep cramping in the muscle in which the needle was inserted. This is the local twitch response.   Marland Kitchen How Will I feel after the treatment? o Soreness is normal, and the onset of soreness may not occur for a few hours. Typically this soreness does not last longer than two days.  o Bruising is uncommon, however; ice can be used to decrease any possible bruising.  o In rare cases feeling tired or nauseous after the treatment is normal. In addition, your symptoms may get worse before they get better, this period will typically not last longer than 24 hours.   . What Can I do After My Treatment? o Increase your hydration by drinking more water for the next 24 hours. o You may place ice or heat on the areas treated that have  become sore, however, do not use heat on inflamed or bruised areas. Heat often brings more relief post needling. o You can continue your regular activities, but vigorous activity is not recommended initially after the treatment for 24 hours. o DN is best combined with other physical therapy such as strengthening, stretching, and other therapies.

## 2016-03-29 ENCOUNTER — Encounter: Payer: Self-pay | Admitting: Rehabilitative and Restorative Service Providers"

## 2016-03-29 ENCOUNTER — Ambulatory Visit (INDEPENDENT_AMBULATORY_CARE_PROVIDER_SITE_OTHER): Payer: 59 | Admitting: Rehabilitative and Restorative Service Providers"

## 2016-03-29 DIAGNOSIS — M791 Myalgia, unspecified site: Secondary | ICD-10-CM

## 2016-03-29 DIAGNOSIS — M79605 Pain in left leg: Secondary | ICD-10-CM

## 2016-03-29 DIAGNOSIS — R29898 Other symptoms and signs involving the musculoskeletal system: Secondary | ICD-10-CM

## 2016-03-29 NOTE — Therapy (Signed)
[place], [place], [address] Phone: [phone]   Fax:  [phone]  Physical Therapy Treatment  Patient Details  Name: Jessica [name] MRN: [id number] Date of Birth: 06/23/1966 Referring Provider: Dr Barbaraann Barthel   Encounter Date: 03/29/2016      PT End of Session - 03/29/16 1157    Visit Number 4   Number of Visits 12   Date for PT Re-Evaluation 04/25/16   PT Start Time [id number]   PT Stop Time [id number]   PT Time Calculation (min) 56 min   Activity Tolerance Patient tolerated treatment well      Past Medical History  Diagnosis Date  . Endometriosis   . HSV-2 (herpes simplex virus 2) infection   . Hypercoagulability due to prothrombin II mutation [facility])     Past Surgical History  Procedure Laterality Date  . Inguinal hernia repair      LEFT  . Tonsillectomy    . Bunionectomy      BOTH FEET  . Pelvic laparoscopy      ENDOMETRIOSIS    There were no vitals filed for this visit.      Subjective Assessment - 03/29/16 1158    Subjective Very sore the day of needling but much better the following day and she had a great day yesterday - no symptoms. Feels that the needling helped. Some increase in symptoms today because she was sitting at work all morning.    Currently in Pain? Yes   Pain Score 2    Pain Location Hip   Pain Orientation Left   Pain Descriptors / Indicators Dull;Aching                         [facility] Adult PT Treatment/Exercise - 03/29/16 0001    Knee/Hip Exercises: Stretches   Hip Flexor Stretch 3 reps;30 seconds;Left   Piriformis Stretch 3 reps;30 seconds   Other Knee/Hip Stretches standing hip extension - hips at counter 20-30 sec x 5    Knee/Hip Exercises: Sidelying   Hip ABduction Left;2 sets;10 reps   Knee/Hip Exercises: Prone   Hip Extension Limitations glut set 10 sec x 10    Moist Heat Therapy   Number Minutes Moist Heat 20 Minutes   Moist Heat  Location Hip;Lumbar Spine  anterior/posterior    Electrical Stimulation   Electrical Stimulation Location Lt glute (piriformis area)/anterior hip/psoas   Electrical Stimulation Action IFC   Electrical Stimulation Parameters to tolerance   Electrical Stimulation Goals Pain;Tone   Manual Therapy   Manual Therapy Soft tissue mobilization   Soft tissue mobilization Lt piriformis/glut medius; iliopsoas musculature           Trigger Point Dry Needling - 03/29/16 1243    Consent Given? Yes   Muscles Treated Lower Body --  hip flexors - twich/decresed mm tightness   Gluteus Maximus Response Twitch response elicited;Palpable increased muscle length   Gluteus Minimus Response Twitch response elicited;Palpable increased muscle length   Piriformis Response Twitch response elicited;Palpable increased muscle length              PT Education - 03/29/16 1207    Education provided Yes   Education Details HEP   Person(s) Educated Patient   Methods Explanation;Demonstration;Tactile cues;Verbal cues;Handout   Comprehension Verbalized understanding;Returned demonstration;Verbal cues required;Tactile cues required             PT Long Term Goals - 03/27/16 1110    PT  LONG TERM GOAL #1   Title Improve tissue extensibility through Lt hip flexors/abductors allowing palpation to tissue with minimal tenderness or tightness 04/25/16   Time 6   Period Weeks   Status On-going   PT LONG TERM GOAL #2   Title Pain free resisted hip strength 5/5 04/25/16   Time 6   Period Weeks   Status On-going   PT LONG TERM GOAL #3   Title Patient reports ability to sit, walk and return to gym program for 45-60 min without increase in symptoms 04/25/16   Time 6   Period Weeks   Status On-going   PT LONG TERM GOAL #4   Title Independent in HEP  04/25/16   Time 6   Period Weeks   Status On-going   PT LONG TERM GOAL #5   Title Improve FOTO to </= 22% limitation 04/24/16   Time 6   Period Weeks   Status  On-going               Plan - 03/29/16 1244    Clinical Impression Statement Good response to TDN with 2 days pain and symptoms free - including no popping through the Lt hip. Added exercise withtout difficulty. Continued with TDN and added work to adductors. Progressing well toward stated goals of therapy.    Rehab Potential Good   PT Frequency 2x / week   PT Duration 6 weeks   PT Treatment/Interventions Patient/family education;ADLs/Self Care Home Management;Neuromuscular re-education;Cryotherapy;Electrical Stimulation;Iontophoresis 4mg /ml Dexamethasone;Moist Heat;Ultrasound;Manual techniques;Dry needling;Therapeutic activities;Therapeutic exercise   PT Next Visit Plan manual work through iliopsoas; manual vs TDN hip musculature; core stabilization; stretching; activie exercise; modalities as indicated. continue TDN    Consulted and Agree with Plan of Care Patient      Patient will benefit from skilled therapeutic intervention in order to improve the following deficits and impairments:  Postural dysfunction, Improper body mechanics, Pain, Decreased range of motion, Decreased strength, Decreased mobility, Increased fascial restricitons, Increased muscle spasms, Decreased endurance, Decreased activity tolerance  Visit Diagnosis: Left leg pain  Myalgia  Other symptoms and signs involving the musculoskeletal system     Problem List Patient Active Problem List   Diagnosis Date Noted  . Left hip pain 03/13/2016  . Multiple thyroid nodules 10/07/2014  . Hypercoagulability due to prothrombin II mutation ([place]) 10/07/2014  . Blood clotting disorder ([place]) 12/27/2013  . Family history of clotting disorder 12/21/2013  . Perimenopause 12/21/2013  . HSV-2 (herpes simplex virus 2) infection     Jovian Lembcke Nilda Simmer PT, MPH  03/29/2016, 12:47 PM  [facility] [place], [place], [address] Phone: [phone]   Fax:   [phone]  Name: Jessica [name] MRN: [id number] Date of Birth: 1966-08-19

## 2016-03-29 NOTE — Patient Instructions (Signed)
Strengthening: Hip Abduction (Side-Lying)    Tighten muscles on front of left thigh, then lift leg _8-10___ inches from surface, keeping knee locked.  Repeat __10__ times per set. Do __2-3__ sets per session. Do __2-3__ sessions per day.   Gluteal Sets    Tighten buttocks while pressing pelvis to floor. Hold _10___ seconds. Repeat _10___ times per set. Do _2-3___ sets per session. Do _2-3___ sessions per day.

## 2016-04-09 ENCOUNTER — Encounter: Payer: 59 | Admitting: Physical Therapy

## 2016-04-10 ENCOUNTER — Ambulatory Visit (INDEPENDENT_AMBULATORY_CARE_PROVIDER_SITE_OTHER): Payer: 59 | Admitting: Rehabilitative and Restorative Service Providers"

## 2016-04-10 ENCOUNTER — Encounter: Payer: Self-pay | Admitting: Rehabilitative and Restorative Service Providers"

## 2016-04-10 DIAGNOSIS — R29898 Other symptoms and signs involving the musculoskeletal system: Secondary | ICD-10-CM

## 2016-04-10 DIAGNOSIS — M791 Myalgia, unspecified site: Secondary | ICD-10-CM

## 2016-04-10 DIAGNOSIS — M79605 Pain in left leg: Secondary | ICD-10-CM | POA: Diagnosis not present

## 2016-04-10 NOTE — Therapy (Addendum)
[place], [place], [address] Phone: [phone]   Fax:  [phone]  Physical Therapy Treatment  Patient Details  Name: Jessica [name] MRN: [id number] Date of Birth: 09/26/1966 Referring Provider: Dr. Barbaraann Barthel  Encounter Date: 04/10/2016      PT End of Session - 04/10/16 1106    Visit Number 5   Number of Visits 12   Date for PT Re-Evaluation 04/25/16   PT Start Time 1100   PT Stop Time 1157   PT Time Calculation (min) 57 min   Activity Tolerance Patient tolerated treatment well      Past Medical History  Diagnosis Date  . Endometriosis   . HSV-2 (herpes simplex virus 2) infection   . Hypercoagulability due to prothrombin II mutation [facility])     Past Surgical History  Procedure Laterality Date  . Inguinal hernia repair      LEFT  . Tonsillectomy    . Bunionectomy      BOTH FEET  . Pelvic laparoscopy      ENDOMETRIOSIS    There were no vitals filed for this visit.      Subjective Assessment - 04/10/16 1103    Subjective On vacation last week. Walked a lot. Noticed that she had much less pain when she did more walking. Sat in church and at work again yesterday and noticed onset of recurrent pain. Gets her standing desk tomorrow and she is excited about that. Sitting is definitely the irritation.    Currently in Pain? Yes   Pain Score 1    Pain Location Hip   Pain Orientation Left   Pain Descriptors / Indicators Aching;Dull   Pain Onset More than a month ago   Pain Frequency Intermittent   Aggravating Factors  sitting   Pain Relieving Factors massage; stretching             [facility] PT Assessment - 04/10/16 0001    Assessment   Medical Diagnosis Lt hip pain    Referring Provider Dr. Barbaraann Barthel   Onset Date/Surgical Date 01/03/16   Hand Dominance Right   Next MD Visit no return scheduled    Prior Therapy chiropractic care and massage   Posture/Postural Control   Posture  Comments improved sitting and standing posture and alignment    AROM   Right/Left Hip --  WFL's   Flexibility   Hamstrings tight Rt ~ 85 deg; lt 85 deg    Quadriceps prone knee flex ~ equal bilat    ITB WFL's   Piriformis tight Lt with pulling and discomfort in the Lt groin    Palpation   Spinal mobility WNL's lumbar spine    SI assessment  WNL's    Palpation comment improved tenderness and tightness through the Lt groin/inguinal ligament area; iliopsoas; piriformis; glut medius                     [facility] Adult PT Treatment/Exercise - 04/10/16 0001    Knee/Hip Exercises: Stretches   Hip Flexor Stretch 3 reps;30 seconds;Left   Piriformis Stretch 3 reps;30 seconds   Other Knee/Hip Stretches standing hip extension - hips at counter 20-30 sec x 5    Knee/Hip Exercises: Aerobic   Nustep L5: 5 min    Knee/Hip Exercises: Standing   Hip Abduction Stengthening;Right;Left;10 reps  green TB    Hip Extension Stengthening;Left;Both;10 reps  green TB    Knee/Hip Exercises: Supine   Bridges Limitations 10 reps  Knee/Hip Exercises: Sidelying   Hip ABduction Left;2 sets;10 reps   Other Sidelying Knee/Hip Exercises abduction with fwd toe tap/back toe tap x 10    Knee/Hip Exercises: Prone   Hip Extension Limitations glut set 10 sec x 10; hip extension alternate x 10 each; opposite arm and leg x 10 each    Modalities   Modalities Cryotherapy;Electrical Stimulation   Cryotherapy   Number Minutes Cryotherapy 15 Minutes   Cryotherapy Location Hip  ant post Lt    Type of Cryotherapy Ice pack   Electrical Stimulation   Electrical Stimulation Location Lt glute (piriformis area)/anterior hip/psoas   Electrical Stimulation Action IFC   Electrical Stimulation Parameters to tolerance    Electrical Stimulation Goals Pain;Tone   Manual Therapy   Manual Therapy Soft tissue mobilization   Soft tissue mobilization Lt piriformis/glut medius; iliopsoas musculature                  PT Education - 04/10/16 1118    Education provided Yes   Education Details HEP    Person(s) Educated Patient   Methods Explanation;Demonstration;Tactile cues;Verbal cues;Handout   Comprehension Verbalized understanding;Returned demonstration;Verbal cues required;Tactile cues required             PT Long Term Goals - 04/10/16 1151    PT LONG TERM GOAL #1   Title Improve tissue extensibility through Lt hip flexors/abductors allowing palpation to tissue with minimal tenderness or tightness 04/25/16   Time 6   Period Weeks   Status On-going   PT LONG TERM GOAL #2   Title Pain free resisted hip strength 5/5 04/25/16   Time 6   Period Weeks   Status On-going   PT LONG TERM GOAL #3   Title Patient reports ability to sit, walk and return to gym program for 45-60 min without increase in symptoms 04/25/16   Time 6   Period Weeks   Status On-going   PT LONG TERM GOAL #4   Title Independent in HEP  04/25/16   Time 6   Period Weeks   Status On-going   PT LONG TERM GOAL #5   Title Improve FOTO to </= 22% limitation 04/24/16   Time 6   Period Weeks   Status On-going               Plan - 04/10/16 1151    Clinical Impression Statement Progressing well with good respoltion of symptoms - most of vacation was pain free. She did well with the walking. Had increased pain when she returned to work sitting at her desk. Has a standing desk arriving tomorrow. Progressing well toward stated goals of therapy.    Rehab Potential Good   PT Frequency 2x / week   PT Duration 6 weeks   PT Treatment/Interventions Patient/family education;ADLs/Self Care Home Management;Neuromuscular re-education;Cryotherapy;Electrical Stimulation;Iontophoresis 77m/ml Dexamethasone;Moist Heat;Ultrasound;Manual techniques;Dry needling;Therapeutic activities;Therapeutic exercise   PT Next Visit Plan manual work through iliopsoas; manual vs TDN hip musculature; core stabilization; stretching; activie exercise; modalities as  indicated. assess for decreased frequency v d/c at next visit.    Consulted and Agree with Plan of Care Patient      Patient will benefit from skilled therapeutic intervention in order to improve the following deficits and impairments:  Postural dysfunction, Improper body mechanics, Pain, Decreased range of motion, Decreased strength, Decreased mobility, Increased fascial restricitons, Increased muscle spasms, Decreased endurance, Decreased activity tolerance  Visit Diagnosis: Left leg pain  Myalgia  Other symptoms and signs involving the musculoskeletal system  Problem List Patient Active Problem List   Diagnosis Date Noted  . Left hip pain 03/13/2016  . Multiple thyroid nodules 10/07/2014  . Hypercoagulability due to prothrombin II mutation ([place]) 10/07/2014  . Blood clotting disorder ([place]) 12/27/2013  . Family history of clotting disorder 12/21/2013  . Perimenopause 12/21/2013  . HSV-2 (herpes simplex virus 2) infection     Curtis Cain Nilda Simmer PT, MPH  04/10/2016, 11:54 AM  [facility] [place], [place], [address] Phone: [phone]   Fax:  [phone]  Name: Jessica [name] MRN: [id number] Date of Birth: 03-30-1966    PHYSICAL THERAPY DISCHARGE SUMMARY  Visits from Start of Care: 5  Current functional level related to goals / functional outcomes: See progress note for discharge status - excellent progress   Remaining deficits: Intermittent symptoms - needs to continue with HEP    Education / Equipment: HEP Plan: Patient agrees to discharge.  Patient goals were met. Patient is being discharged due to being pleased with the current functional level.  ?????    Jovahn Breit P. Helene Kelp PT, MPH 06/07/16 9:13 AM

## 2016-04-10 NOTE — Patient Instructions (Addendum)
Butterfly, Supine    Lie on back, feet together. Lower knees toward floor. Hold _30-60__ seconds. Repeat _2-3__ times per session. Do _2__ sessions per day.   Strengthening: Hip Extension (Prone)    Tighten muscles on front of left thigh, then lift leg __8-10__ inches from surface, keeping knee locked. Repeat __10__ times per set. Do _1-2___ sets per session. Do __1__ sessions per day.    Opposite Arm / Leg Lift (Prone)    Abdomen and head supported, left knee locked, raise leg and opposite arm ___8-10_ inches from floor. Repeat _10___ times per set. Do _1-2___ sets per session. Do __1__ sessions per day.

## 2016-04-12 ENCOUNTER — Encounter: Payer: 59 | Admitting: Physical Therapy

## 2016-04-17 ENCOUNTER — Encounter: Payer: 59 | Admitting: Rehabilitative and Restorative Service Providers"

## 2016-08-14 ENCOUNTER — Other Ambulatory Visit: Payer: Self-pay | Admitting: Gynecology

## 2016-08-14 DIAGNOSIS — Z1231 Encounter for screening mammogram for malignant neoplasm of breast: Secondary | ICD-10-CM

## 2016-09-18 ENCOUNTER — Encounter: Payer: Self-pay | Admitting: *Deleted

## 2016-09-18 ENCOUNTER — Emergency Department
Admission: EM | Admit: 2016-09-18 | Discharge: 2016-09-18 | Discharge status: Absent without leave | Payer: 59 | Source: Home / Self Care

## 2016-09-18 ENCOUNTER — Emergency Department
Admission: EM | Admit: 2016-09-18 | Discharge: 2016-09-18 | Disposition: A | Discharge status: Home / Self Care | Payer: 59 | Source: Home / Self Care | Attending: Family Medicine | Admitting: Family Medicine

## 2016-09-18 DIAGNOSIS — J069 Acute upper respiratory infection, unspecified: Secondary | ICD-10-CM

## 2016-09-18 DIAGNOSIS — B9789 Other viral agents as the cause of diseases classified elsewhere: Secondary | ICD-10-CM | POA: Diagnosis not present

## 2016-09-18 HISTORY — DX: Ventricular premature depolarization: I49.3

## 2016-09-18 LAB — POCT INFLUENZA A/B
Influenza A, POC: NEGATIVE
Influenza B, POC: NEGATIVE

## 2016-09-18 MED ORDER — AZITHROMYCIN 250 MG PO TABS: ORAL_TABLET | ORAL | 0 refills | Status: DC

## 2016-09-18 NOTE — Discharge Instructions (Signed)
Take plain guaifenesin (1200mg  extended release tabs such as Mucinex) twice daily, with plenty of water, for cough and congestion.  May add Pseudoephedrine (30mg , one or two every 4 to 6 hours) for sinus congestion.  Get adequate rest.   May use Afrin nasal spray (or generic oxymetazoline) twice daily for about 5 days and then discontinue.  Also recommend using saline nasal spray several times daily and saline nasal irrigation (AYR is a common brand).  Use Flonase nasal spray each morning after using Afrin nasal spray and saline nasal irrigation. Try warm salt water gargles for sore throat.  Stop all antihistamines for now, and other non-prescription cough/cold preparations. May take Tylenol as needed for fever, headache, sore throat, etc.  May take Delsym Cough Suppressant at bedtime for nighttime cough.  Begin Azithromycin if not improving about one week or if persistent fever develops   Follow-up with family doctor if not improving about10 days.

## 2016-09-18 NOTE — ED Triage Notes (Signed)
Pt c/o cough, nausea, pain in center of her chest, fatigue and decreased appetite x 1 day. Denies fever.

## 2016-09-18 NOTE — ED Provider Notes (Signed)
[facility]    CSN: [id number] Arrival date & time: 09/18/16  1625     History   Chief Complaint Chief Complaint  Patient presents with  . Cough    HPI Jessica [name] is a 50 y.o. female.   About 4 days ago patient became fatigued.  Yesterday she developed cough, chest congestion, nausea without vomiting, sore throat, myalgias, and pressure sensation in her anterior chest.  She feels cold but has not had a fever.  She has a history of seasonal rhinitis, and last year had pneumonia.   The history is provided by the patient.    Past Medical History:  Diagnosis Date  . Endometriosis   . HSV-2 (herpes simplex virus 2) infection   . 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 PVC's (premature ventricular contractions)     Patient Active Problem List   Diagnosis Date Noted  . Left hip pain 03/13/2016  . Multiple thyroid nodules 10/07/2014  . 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 Blood clotting disorder ([place]) 12/27/2013  . Family history of clotting disorder 12/21/2013  . Perimenopause 12/21/2013  . HSV-2 (herpes simplex virus 2) infection     Past Surgical History:  Procedure Laterality Date  . BUNIONECTOMY     BOTH FEET  . HERNIA REPAIR    . INGUINAL HERNIA REPAIR     LEFT  . PELVIC LAPAROSCOPY     ENDOMETRIOSIS  . TONSILLECTOMY      OB History    Gravida Para Term Preterm AB Living   2 1     1 1    SAB TAB Ectopic Multiple Live Births   1               Home Medications    Prior to Admission medications   Medication Sig Start Date End Date Taking? Authorizing Provider  Cetirizine HCl (ZYRTEC PO) Take by mouth daily. 1/2 pill daily    Yes Historical Provider, MD  valACYclovir (VALTREX) 1000 MG tablet TAKE 1 TABLET BY MOUTH DAILY 02/19/16  Yes Terrance Mass, MD  azithromycin (ZITHROMAX Z-PAK) 250 MG tablet Take 2 tabs today; then begin one tab once daily for 4 more days. (Rx void after  09/26/16) 09/18/16   Kandra Nicolas, MD  calcium carbonate (OS-CAL) 600 MG TABS Take 600 mg by mouth 2 (two) times daily with a meal.      Historical Provider, MD  Estradiol 10 MCG TABS vaginal tablet Place 1 tablet (10 mcg total) vaginally 2 (two) times a week. 10/27/15   Terrance Mass, MD  fluticasone (FLONASE) 50 MCG/ACT nasal spray Place 2 sprays into both nostrils daily.    Historical Provider, MD  Multiple Vitamin (MULTIVITAMIN) tablet Take 1 tablet by mouth daily.      Historical Provider, MD  sertraline (ZOLOFT) 50 MG tablet Take 1 tablet (50 mg total) by mouth daily. 10/27/15   Terrance Mass, MD    Family History Family History  Problem Relation Age of Onset  . Cancer Sister     breast  . Breast cancer Maternal Aunt   . Cancer Maternal Aunt     COLON  . Cancer Paternal Uncle     COLON  . Cancer Maternal Grandmother     OVARIAN  . Diabetes Paternal Grandmother   . Cancer Paternal Grandfather     COLON    Social History Social History  Substance Use Topics  . Smoking status:  Former Smoker    Types: Cigarettes    Quit date: 01/18/2007  . Smokeless tobacco: Never Used  . Alcohol use 0.0 oz/week     Comment: 1-2 glasses of wine/day     Allergies   Oxycodone-acetaminophen; Propoxyphene; Codeine; and Diclofenac   Review of Systems Review of Systems  + sore throat + cough + pleuritic pain No wheezing + nasal congestion + post-nasal drainage No sinus pain/pressure No itchy/red eyes No earache No hemoptysis No SOB No fever, + chills + nausea No vomiting No abdominal pain No diarrhea No urinary symptoms No skin rash + fatigue + myalgias No headache Used OTC meds without relief    Physical Exam Triage Vital Signs ED Triage Vitals  Enc Vitals Group     BP 09/18/16 1707 122/80     Pulse Rate 09/18/16 1707 (!) 58     Resp 09/18/16 1707 16     Temp 09/18/16 1707 98.1 F (36.7 C)     Temp Source 09/18/16 1707 Oral     SpO2 09/18/16 1707 100 %      Weight 09/18/16 1708 153 lb (69.4 kg)     Height 09/18/16 1708 5' 6.5" (1.689 m)     Head Circumference --      Peak Flow --      Pain Score 09/18/16 1710 0     Pain Loc --      Pain Edu? --      Excl. in [place]? --    No data found.   Updated Vital Signs BP 122/80 (BP Location: Left Arm)   Pulse (!) 58   Temp 98.1 F (36.7 C) (Oral)   Resp 16   Ht 5' 6.5" (1.689 m)   Wt 153 lb (69.4 kg)   SpO2 100%   BMI 24.32 kg/m   Visual Acuity Right Eye Distance:   Left Eye Distance:   Bilateral Distance:    Right Eye Near:   Left Eye Near:    Bilateral Near:     Physical Exam Nursing notes and Vital Signs reviewed. Appearance:  Patient appears stated age, and in no acute distress Eyes:  Pupils are equal, round, and reactive to light and accomodation.  Extraocular movement is intact.  Conjunctivae are not inflamed  Ears:  Canals normal.  Tympanic membranes normal.  Nose:  Mildly congested turbinates.  No sinus tenderness.   Pharynx:  Normal Neck:  Supple.  Tender enlarged posterior/lateral nodes are palpated bilaterally, but primarily on the left.  Lungs:  Clear to auscultation.  Breath sounds are equal.  Moving air well. Heart:  Regular rate and rhythm without murmurs, rubs, or gallops.  Abdomen:  Nontender without masses or hepatosplenomegaly.  Bowel sounds are present.  No CVA or flank tenderness.  Extremities:  No edema.  No lower leg tenderness, warmth, or swelling. Skin:  No rash present.    UC Treatments / Results  Labs (all labs ordered are listed, but only abnormal results are displayed) Labs Reviewed  POCT INFLUENZA A/B negative    EKG  EKG Interpretation None       Radiology No results found.  Procedures Procedures (including critical care time)  Medications Ordered in UC Medications - No data to display   Initial Impression / Assessment and Plan / UC Course  I have reviewed the triage vital signs and the nursing notes.  Pertinent labs & imaging  results that were available during my care of the patient were reviewed by me and considered  in my medical decision making (see chart for details).  Clinical Course   There is no evidence of bacterial infection today.  There is no clinical evidence of PE. Take plain guaifenesin (1200mg  extended release tabs such as Mucinex) twice daily, with plenty of water, for cough and congestion.  May add Pseudoephedrine (30mg , one or two every 4 to 6 hours) for sinus congestion.  Get adequate rest.   May use Afrin nasal spray (or generic oxymetazoline) twice daily for about 5 days and then discontinue.  Also recommend using saline nasal spray several times daily and saline nasal irrigation (AYR is a common brand).  Use Flonase nasal spray each morning after using Afrin nasal spray and saline nasal irrigation. Try warm salt water gargles for sore throat.  Stop all antihistamines for now, and other non-prescription cough/cold preparations. May take Tylenol as needed for fever, headache, sore throat, etc.  May take Delsym Cough Suppressant at bedtime for nighttime cough.  Begin Azithromycin if not improving about one week or if persistent fever develops (Given a prescription to hold, with an expiration date)  Follow-up with family doctor if not improving about10 days.  If symptoms become significantly worse during the night or over the weekend, proceed to the local emergency room.      Final Clinical Impressions(s) / UC Diagnoses   Final diagnoses:  Viral URI with cough    New Prescriptions New Prescriptions   AZITHROMYCIN (ZITHROMAX Z-PAK) 250 MG TABLET    Take 2 tabs today; then begin one tab once daily for 4 more days. (Rx void after 09/26/16)     Kandra Nicolas, MD 09/26/16 [phone]

## 2016-10-16 ENCOUNTER — Encounter: Payer: 59 | Admitting: Gynecology

## 2016-10-16 ENCOUNTER — Ambulatory Visit: Payer: 59

## 2016-10-29 ENCOUNTER — Other Ambulatory Visit: Payer: Self-pay | Admitting: Gynecology

## 2016-11-01 DIAGNOSIS — H9312 Tinnitus, left ear: Secondary | ICD-10-CM | POA: Insufficient documentation

## 2016-11-04 ENCOUNTER — Ambulatory Visit (INDEPENDENT_AMBULATORY_CARE_PROVIDER_SITE_OTHER): Payer: 59 | Admitting: Gynecology

## 2016-11-04 ENCOUNTER — Encounter: Payer: Self-pay | Admitting: Gynecology

## 2016-11-04 VITALS — BP 128/80 | Ht 67.0 in | Wt 154.0 lb

## 2016-11-04 DIAGNOSIS — Z01419 Encounter for gynecological examination (general) (routine) without abnormal findings: Secondary | ICD-10-CM

## 2016-11-04 DIAGNOSIS — Z780 Asymptomatic menopausal state: Secondary | ICD-10-CM

## 2016-11-04 MED ORDER — VALACYCLOVIR HCL 1 G PO TABS: 1000.0000 mg | ORAL_TABLET | Freq: Every day | ORAL | 8 refills | Status: DC

## 2016-11-04 NOTE — Progress Notes (Signed)
Jessica [name] [birth year] [id number]   History:    51 y.o.  for annual gyn exam with no complaints today. In July 2017 she had an [facility] which was in the menopausal range. She is not sexually active has had no vaginal bleeding has no vasomotor symptoms Patient is heterozygous for prothrombin 2 gene mutation that was detected when she was screened for thrombophilia due to the fact of both parents have had DVTs. 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 Review of patient's records indicated she has a family history of colon cancer whereby her her grandfather and uncles and aunts have had colon cancer. Patient had a normal colonoscopy in 2010 . Patient had a colonoscopy in 2015 and benign polyps were removed and she is on a 3 year recall. Patient with no past history of any abnormal Pap smears.Also patient has a great aunt as well as her sister with history of breast cancer and patient has been offered in the past undergo BRCA one BRCA2 testing but has refused.   in December 2015 she was found to have a Left thyroid nodule an had an ultrasound and fine-needle aspiration with the following reported:  IMPRESSION: Bilateral nodules. Left upper pole complex nodule is dominant and measures 2.6 cm. Findings meet consensus criteria for biopsy. Ultrasound-guided fine needle aspiration should be considered,   Cytology from fine-needle aspiration demonstrated the following: Diagnosis THYROID, FINE NEEDLE ASPIRATION, LEFT UPPER POLE (SPECIMEN 1 OF 1 COLLECTED 11-08-2014) CONSISTENT WITH BENIGN FOLLICULAR NODULE (BETHESDA CATEGORY II).  Patient has not  felt a thyroid nodule on self exam.  Past medical history,surgical history, family history and social history were all reviewed and documented in the EPIC chart.  Gynecologic History No LMP recorded. Patient is not currently having periods (Reason: Perimenopausal). Contraception: post menopausal status Last Pap: 2014. Results were: normal Last mammogram: 2016. Results were: Normal but dense had three-dimensional mammogram  Obstetric History OB History  Gravida Para Term Preterm AB Living  '2 1     1 1  '$ SAB TAB Ectopic Multiple Live Births  1            # Outcome Date GA Lbr Len/2nd Weight Sex Delivery Anes PTL Lv  2 SAB           1 Para     M Vag-Spont          ROS: A ROS was performed and pertinent positives and negatives are included in the history.  GENERAL: No fevers or chills. HEENT: No change in vision, no earache, sore throat or sinus congestion. NECK: No pain or stiffness. CARDIOVASCULAR: No chest pain or pressure. No palpitations. PULMONARY: No shortness of breath, cough or wheeze. GASTROINTESTINAL: No abdominal pain, nausea, vomiting or diarrhea, melena or bright red blood per rectum. GENITOURINARY: No urinary frequency, urgency, hesitancy or dysuria. MUSCULOSKELETAL: No joint or muscle pain, no back pain, no recent trauma. DERMATOLOGIC: No rash, no itching, no lesions. ENDOCRINE: No polyuria, polydipsia, no heat or cold intolerance. No recent change in weight. HEMATOLOGICAL: No anemia or easy bruising or bleeding. NEUROLOGIC: No headache, seizures, numbness, tingling or weakness. PSYCHIATRIC: No depression,  no loss of interest in normal activity or change in sleep pattern.     Exam: chaperone present  BP 128/80   Ht '5\' 7"'$  (1.702 m)   Wt 154 lb (69.9 kg)   BMI 24.12 kg/m   Body mass index is 24.12 kg/m.  General appearance : Well developed well nourished female. No acute distress HEENT: Eyes: no retinal hemorrhage or exudates,  Neck supple, trachea midline, no  carotid bruits, no thyroidmegaly Lungs: Clear to auscultation, no rhonchi or wheezes, or rib retractions  Heart: Regular rate and rhythm, no murmurs or gallops Breast:Examined in sitting and supine position were symmetrical in appearance, no palpable masses or tenderness,  no skin retraction, no nipple inversion, no nipple discharge, no skin discoloration, no axillary or supraclavicular lymphadenopathy Abdomen: no palpable masses or tenderness, no rebound or guarding Extremities: no edema or skin discoloration or tenderness  Pelvic:  Bartholin, Urethra, Skene Glands: Within normal limits             Vagina: No gross lesions or discharge  Cervix: No gross lesions or discharge  Uterus  anteverted, normal size, shape and consistency, non-tender and mobile  Adnexa  Without masses or tenderness  Anus and perineum  normal   Rectovaginal  normal sphincter tone without palpated masses or tenderness             Hemoccult colonoscopy this year     Assessment/Plan:  51 y.o. female for annual exam will return back to the office next week for the following screening fasting blood work: Comprehensive metabolic panel, fasting lipid profile, TSH, CBC, and urinalysis. Pap smear was also done today. Patient had her flu vaccine early this year. She scheduled for colonoscopy this year because of her history of colon polyps and family history. Prescription refill for her Valtrex which she takes 500 mg daily for prophylaxis of HSV. She was reminded of the importance of calcium vitamin D and weightbearing exercises for osteoporosis prevention. We also discussed importance of monthly breast exams.   Terrance Mass MD, 4:37 PM 11/04/2016

## 2016-11-05 ENCOUNTER — Ambulatory Visit
Admission: RE | Admit: 2016-11-05 | Discharge: 2016-11-05 | Disposition: A | Discharge status: Home / Self Care | Payer: 59 | Source: Ambulatory Visit | Attending: Gynecology | Admitting: Gynecology

## 2016-11-05 DIAGNOSIS — Z1231 Encounter for screening mammogram for malignant neoplasm of breast: Secondary | ICD-10-CM

## 2016-11-05 LAB — URINALYSIS W MICROSCOPIC + REFLEX CULTURE
BILIRUBIN URINE: NEGATIVE
Bacteria, UA: NONE SEEN [HPF]
Casts: NONE SEEN [LPF]
Crystals: NONE SEEN [HPF]
GLUCOSE, UA: NEGATIVE
Hgb urine dipstick: NEGATIVE
Ketones, ur: NEGATIVE
LEUKOCYTES UA: NEGATIVE
Nitrite: NEGATIVE
Protein, ur: NEGATIVE
RBC / HPF: NONE SEEN RBC/HPF (ref <=2)
SPECIFIC GRAVITY, URINE: 1.004 (ref 1.001–1.035)
Squamous Epithelial / LPF: NONE SEEN [HPF] (ref <=5)
WBC UA: NONE SEEN WBC/HPF (ref <=5)
YEAST: NONE SEEN [HPF]
pH: 6.5 (ref 5.0–8.0)

## 2016-11-05 LAB — PAP IG W/ RFLX HPV ASCU

## 2016-11-12 ENCOUNTER — Other Ambulatory Visit: Payer: 59

## 2016-11-13 ENCOUNTER — Other Ambulatory Visit: Payer: 59

## 2016-11-13 LAB — CBC WITH DIFFERENTIAL/PLATELET
BASOS ABS: 0 {cells}/uL (ref 0–200)
Basophils Relative: 0 %
EOS PCT: 1 %
Eosinophils Absolute: 55 cells/uL (ref 15–500)
HCT: 36.1 % (ref 35.0–45.0)
HEMOGLOBIN: 12.2 g/dL (ref 11.7–15.5)
LYMPHS ABS: 1705 {cells}/uL (ref 850–3900)
Lymphocytes Relative: 31 %
MCH: 31.7 pg (ref 27.0–33.0)
MCHC: 33.8 g/dL (ref 32.0–36.0)
MCV: 93.8 fL (ref 80.0–100.0)
MONOS PCT: 7 %
MPV: 10.2 fL (ref 7.5–12.5)
Monocytes Absolute: 385 cells/uL (ref 200–950)
NEUTROS PCT: 61 %
Neutro Abs: 3355 cells/uL (ref 1500–7800)
PLATELETS: 176 10*3/uL (ref 140–400)
RBC: 3.85 MIL/uL (ref 3.80–5.10)
RDW: 12.8 % (ref 11.0–15.0)
WBC: 5.5 10*3/uL (ref 3.8–10.8)

## 2016-11-13 LAB — COMPREHENSIVE METABOLIC PANEL
ALT: 17 U/L (ref 6–29)
AST: 24 U/L (ref 10–35)
Albumin: 4.1 g/dL (ref 3.6–5.1)
Alkaline Phosphatase: 34 U/L (ref 33–130)
BILIRUBIN TOTAL: 0.4 mg/dL (ref 0.2–1.2)
BUN: 8 mg/dL (ref 7–25)
CHLORIDE: 98 mmol/L (ref 98–110)
CO2: 27 mmol/L (ref 20–31)
CREATININE: 0.6 mg/dL (ref 0.50–1.05)
Calcium: 9.2 mg/dL (ref 8.6–10.4)
GLUCOSE: 101 mg/dL — AB (ref 65–99)
Potassium: 4.5 mmol/L (ref 3.5–5.3)
SODIUM: 133 mmol/L — AB (ref 135–146)
Total Protein: 6.5 g/dL (ref 6.1–8.1)

## 2016-11-13 LAB — LIPID PANEL
Cholesterol: 174 mg/dL (ref <200)
HDL: 98 mg/dL (ref >50)
LDL CALC: 66 mg/dL (ref <100)
TRIGLYCERIDES: 52 mg/dL (ref <150)
Total CHOL/HDL Ratio: 1.8 Ratio (ref <5.0)
VLDL: 10 mg/dL (ref <30)

## 2016-11-13 LAB — TSH: TSH: 0.93 m[IU]/L

## 2017-03-05 ENCOUNTER — Encounter: Payer: Self-pay | Admitting: Gynecology

## 2017-03-15 DIAGNOSIS — J302 Other seasonal allergic rhinitis: Secondary | ICD-10-CM | POA: Insufficient documentation

## 2017-04-10 ENCOUNTER — Telehealth: Payer: Self-pay | Admitting: *Deleted

## 2017-04-10 NOTE — Telephone Encounter (Signed)
Pt called requesting a new Rx for Boric acid supp, last filled in 2016. Pt is leaving for [place] for 3 weeks next week and would like to have on hand. Please advise

## 2017-04-11 MED ORDER — NONFORMULARY OR COMPOUNDED ITEM: 0 refills | Status: DC

## 2017-04-11 NOTE — Telephone Encounter (Signed)
Boric  Acid Suppositories 600 mg # 21. Apply one vaginal qhs

## 2017-04-11 NOTE — Telephone Encounter (Signed)
Rx called in, pt aware 

## 2017-10-03 ENCOUNTER — Other Ambulatory Visit: Payer: Self-pay | Admitting: Obstetrics & Gynecology

## 2017-10-03 DIAGNOSIS — Z139 Encounter for screening, unspecified: Secondary | ICD-10-CM

## 2017-11-06 ENCOUNTER — Ambulatory Visit: Payer: 59

## 2017-11-07 ENCOUNTER — Ambulatory Visit
Admission: RE | Admit: 2017-11-07 | Discharge: 2017-11-07 | Disposition: A | Discharge status: Home / Self Care | Payer: 59 | Source: Ambulatory Visit | Attending: Obstetrics & Gynecology | Admitting: Obstetrics & Gynecology

## 2017-11-07 DIAGNOSIS — Z1231 Encounter for screening mammogram for malignant neoplasm of breast: Secondary | ICD-10-CM | POA: Diagnosis not present

## 2017-11-07 DIAGNOSIS — Z139 Encounter for screening, unspecified: Secondary | ICD-10-CM

## 2017-11-14 ENCOUNTER — Encounter: Payer: Self-pay | Admitting: Obstetrics & Gynecology

## 2017-11-14 ENCOUNTER — Ambulatory Visit (INDEPENDENT_AMBULATORY_CARE_PROVIDER_SITE_OTHER): Payer: BLUE CROSS/BLUE SHIELD | Admitting: Obstetrics & Gynecology

## 2017-11-14 VITALS — BP 122/78 | Ht 66.5 in | Wt 148.0 lb

## 2017-11-14 DIAGNOSIS — Z01419 Encounter for gynecological examination (general) (routine) without abnormal findings: Secondary | ICD-10-CM

## 2017-11-14 DIAGNOSIS — Z1151 Encounter for screening for human papillomavirus (HPV): Secondary | ICD-10-CM

## 2017-11-14 DIAGNOSIS — Z13820 Encounter for screening for osteoporosis: Secondary | ICD-10-CM

## 2017-11-14 DIAGNOSIS — Z8619 Personal history of other infectious and parasitic diseases: Secondary | ICD-10-CM | POA: Diagnosis not present

## 2017-11-14 DIAGNOSIS — Z780 Asymptomatic menopausal state: Secondary | ICD-10-CM | POA: Diagnosis not present

## 2017-11-14 MED ORDER — VALACYCLOVIR HCL 1 G PO TABS: 500.0000 mg | ORAL_TABLET | Freq: Every day | ORAL | 4 refills | Status: DC

## 2017-11-14 MED ORDER — CALCIUM CARBONATE 600 MG PO TABS: 600.0000 mg | ORAL_TABLET | Freq: Two times a day (BID) | ORAL | 3 refills | Status: DC

## 2017-11-14 NOTE — Progress Notes (Signed)
Jessica [name] 1966/09/20 [id number]   History:    52 y.o. G2P1A1L1 Single.  Works in [employer].  Son is 57 yo Paramedic at [employer]  RP:  Established patient presenting for annual gyn exam   HPI:  Menopause, well without HRT.  Currently abstinent, no IC x last Pap test.  No pelvic pain.  Normal vaginal secretions.  No recurrence of Genital HSV on Valacyclovir prophylaxis.  Breasts wnl.  Mammo neg 10/2017.  Will help her schedule a colonoscopy with [facility].  F/u here for health labs.  Per Dr Toney Rakes' note 11/04/2016:  In July 2017 she had an [facility] which was in the menopausal range. She is not sexually active has had no vaginal bleeding has no vasomotor symptoms  Patient is heterozygous for prothrombin 2 gene mutation that was detected when she was screened for thrombophilia due to the fact of both parents have had DVTs. Other testing were done and were as follows: CBC normal PT PTT within normal limits  Protein C normal  Lupus anticoagulant not detected  Antithrombin 3 function normal  Fibrinogen normal  Anti-cardiolipin antibodies IgG IgM IgA normal range  Factor V mutation negative  Protein S normal  Patient with past history of chronic HSV has been on Valtrex 500 mg daily for prophylaxis. Review of patient's records indicated she has a family history of colon cancer whereby her her grandfather and uncles and aunts have had colon cancer. Patient had a normal colonoscopy in 2010 . Patient had a colonoscopy in 2015 and benign polyps were removed and she is on a 3 year recall. Patient with no past history of any abnormal Pap smears.Also patient has a great aunt as well as her sister with history of breast cancer and patient has been offered in the past undergo BRCA one BRCA2 testing but has refused.  Past medical history,surgical history, family history and social history were all reviewed and documented in the EPIC chart.  Gynecologic History No LMP recorded. Patient is not currently  having periods (Reason: Perimenopausal). Contraception: Menopausal Last Pap: 2018. Results were: Negative Last mammogram: 10/2017. Results were: Negative [place] 2015, 3 yr schedule No Bone density yet  Obstetric History OB History  Gravida Para Term Preterm AB Living  _0 SAB TAB Ectopic Multiple Live Births  1            # Outcome Date GA Lbr Len/2nd Weight Sex Delivery Anes PTL Lv  2 SAB           1 Para     M Vag-Spont          ROS: A ROS was performed and pertinent positives and negatives are included in the history.  GENERAL: No fevers or chills. HEENT: No change in vision, no earache, sore throat or sinus congestion. NECK: No pain or stiffness. CARDIOVASCULAR: No chest pain or pressure. No palpitations. PULMONARY: No shortness of breath, cough or wheeze. GASTROINTESTINAL: No abdominal pain, nausea, vomiting or diarrhea, melena or bright red blood per rectum. GENITOURINARY: No urinary frequency, urgency, hesitancy or dysuria. MUSCULOSKELETAL: No joint or muscle pain, no back pain, no recent trauma. DERMATOLOGIC: No rash, no itching, no lesions. ENDOCRINE: No polyuria, polydipsia, no heat or cold intolerance. No recent change in weight. HEMATOLOGICAL: No anemia or easy bruising or bleeding. NEUROLOGIC: No headache, seizures, numbness, tingling or weakness. PSYCHIATRIC: No depression, no loss of interest in normal activity or change in sleep pattern.  Exam:   Ht 5' 6.5" (1.689 m)   Wt 148 lb (67.1 kg)   BMI 23.53 kg/m   Body mass index is 23.53 kg/m.  General appearance : Well developed well nourished female. No acute distress HEENT: Eyes: no retinal hemorrhage or exudates,  Neck supple, trachea midline, no carotid bruits, no thyroidmegaly Lungs: Clear to auscultation, no rhonchi or wheezes, or rib retractions  Heart: Regular rate and rhythm, no murmurs or gallops Breast:Examined in sitting and supine position were symmetrical in appearance, no palpable masses or  tenderness,  no skin retraction, no nipple inversion, no nipple discharge, no skin discoloration, no axillary or supraclavicular lymphadenopathy Abdomen: no palpable masses or tenderness, no rebound or guarding Extremities: no edema or skin discoloration or tenderness  Pelvic: Vulva normal  Bartholin, Urethra, Skene Glands: Within normal limits             Vagina: No gross lesions or discharge  Cervix: No gross lesions or discharge.  Pap/HR HPV done  Uterus  AV, normal size, shape and consistency, non-tender and mobile  Adnexa  Without masses or tenderness  Anus and perineum  normal    Assessment/Plan:  52 y.o. female for annual exam   1. Encounter for routine gynecological examination with Papanicolaou smear of cervix Normal gynecologic exam.  Pap test with high risk HPV done today.  Breast exam normal.  Recent screening mammogram negative in January 2019.  Will follow up fasting for health labs here.  Message sent to organize a screening colonoscopy. - CBC; Future - Comp Met (CMET); Future - Lipid panel; Future - TSH; Future - Vitamin D 1,25 dihydroxy; Future  2. Menopause present Well on no hormone replacement therapy.  No postmenopausal bleeding.  3. Screening for osteoporosis Vitamin D supplements, calcium rich nutrition and regular weightbearing physical activity recommended.  Follow-up here for bone density. - DG Bone Density; Future  4. H/O herpes genitalis No recurrences on valacyclovir prophylaxis.  No contraindication.  Valaciclovir represcribed.  Other orders - valACYclovir (VALTREX) 1000 MG tablet; Take 0.5 tablets (500 mg total) by mouth daily.  Counseling on above issues >50% x 10 minutes.  [name] MD, 3:29 PM 11/14/2017

## 2017-11-17 ENCOUNTER — Encounter: Payer: Self-pay | Admitting: Obstetrics & Gynecology

## 2017-11-17 ENCOUNTER — Telehealth: Payer: Self-pay | Admitting: *Deleted

## 2017-11-17 ENCOUNTER — Encounter: Payer: Self-pay | Admitting: Gastroenterology

## 2017-11-17 DIAGNOSIS — Z1211 Encounter for screening for malignant neoplasm of colon: Secondary | ICD-10-CM

## 2017-11-17 NOTE — Patient Instructions (Signed)
1. Encounter for routine gynecological examination with Papanicolaou smear of cervix Normal gynecologic exam.  Pap test with high risk HPV done today.  Breast exam normal.  Recent screening mammogram negative in January 2019.  Will follow up fasting for health labs here.  Message sent to organize a screening colonoscopy. - CBC; Future - Comp Met (CMET); Future - Lipid panel; Future - TSH; Future - Vitamin D 1,25 dihydroxy; Future  2. Menopause present Well on no hormone replacement therapy.  No postmenopausal bleeding.  3. Screening for osteoporosis Vitamin D supplements, calcium rich nutrition and regular weightbearing physical activity recommended.  Follow-up here for bone density. - DG Bone Density; Future  4. H/O herpes genitalis No recurrences on valacyclovir prophylaxis.  No contraindication.  Valaciclovir represcribed.  Other orders - valACYclovir (VALTREX) 1000 MG tablet; Take 0.5 tablets (500 mg total) by mouth daily.  Jessica [name], it was a pleasure meeting you today!  I will inform you of your results as soon as they are available.   Health Maintenance for Postmenopausal Women Menopause is a normal process in which your reproductive ability comes to an end. This process happens gradually over a span of months to years, usually between the ages of 21 and 44. Menopause is complete when you have missed 12 consecutive menstrual periods. It is important to talk with your health care provider about some of the most common conditions that affect postmenopausal women, such as heart disease, cancer, and bone loss (osteoporosis). Adopting a healthy lifestyle and getting preventive care can help to promote your health and wellness. Those actions can also lower your chances of developing some of these common conditions. What should I know about menopause? During menopause, you may experience a number of symptoms, such as:  Moderate-to-severe hot flashes.  Night sweats.  Decrease in sex  drive.  Mood swings.  Headaches.  Tiredness.  Irritability.  Memory problems.  Insomnia.  Choosing to treat or not to treat menopausal changes is an individual decision that you make with your health care provider. What should I know about hormone replacement therapy and supplements? Hormone therapy products are effective for treating symptoms that are associated with menopause, such as hot flashes and night sweats. Hormone replacement carries certain risks, especially as you become older. If you are thinking about using estrogen or estrogen with progestin treatments, discuss the benefits and risks with your health care provider. What should I know about heart disease and stroke? Heart disease, heart attack, and stroke become more likely as you age. This may be due, in part, to the hormonal changes that your body experiences during menopause. These can affect how your body processes dietary fats, triglycerides, and cholesterol. Heart attack and stroke are both medical emergencies. There are many things that you can do to help prevent heart disease and stroke:  Have your blood pressure checked at least every 1-2 years. High blood pressure causes heart disease and increases the risk of stroke.  If you are 50-52 years old, ask your health care provider if you should take aspirin to prevent a heart attack or a stroke.  Do not use any tobacco products, including cigarettes, chewing tobacco, or electronic cigarettes. If you need help quitting, ask your health care provider.  It is important to eat a healthy diet and maintain a healthy weight. ? Be sure to include plenty of vegetables, fruits, low-fat dairy products, and lean protein. ? Avoid eating foods that are high in solid fats, added sugars, or salt (sodium).  Get regular exercise. This is one of the most important things that you can do for your health. ? Try to exercise for at least 150 minutes each week. The type of exercise that  you do should increase your heart rate and make you sweat. This is known as moderate-intensity exercise. ? Try to do strengthening exercises at least twice each week. Do these in addition to the moderate-intensity exercise.  Know your numbers.Ask your health care provider to check your cholesterol and your blood glucose. Continue to have your blood tested as directed by your health care provider.  What should I know about cancer screening? There are several types of cancer. Take the following steps to reduce your risk and to catch any cancer development as early as possible. Breast Cancer  Practice breast self-awareness. ? This means understanding how your breasts normally appear and feel. ? It also means doing regular breast self-exams. Let your health care provider know about any changes, no matter how small.  If you are 51 or older, have a clinician do a breast exam (clinical breast exam or CBE) every year. Depending on your age, family history, and medical history, it may be recommended that you also have a yearly breast X-ray (mammogram).  If you have a family history of breast cancer, talk with your health care provider about genetic screening.  If you are at high risk for breast cancer, talk with your health care provider about having an MRI and a mammogram every year.  Breast cancer (BRCA) gene test is recommended for women who have family members with BRCA-related cancers. Results of the assessment will determine the need for genetic counseling and BRCA1 and for BRCA2 testing. BRCA-related cancers include these types: ? Breast. This occurs in males or females. ? Ovarian. ? Tubal. This may also be called fallopian tube cancer. ? Cancer of the abdominal or pelvic lining (peritoneal cancer). ? Prostate. ? Pancreatic.  Cervical, Uterine, and Ovarian Cancer Your health care provider may recommend that you be screened regularly for cancer of the pelvic organs. These include your  ovaries, uterus, and vagina. This screening involves a pelvic exam, which includes checking for microscopic changes to the surface of your cervix (Pap test).  For women ages 21-65, health care providers may recommend a pelvic exam and a Pap test every three years. For women ages 60-65, they may recommend the Pap test and pelvic exam, combined with testing for human papilloma virus (HPV), every five years. Some types of HPV increase your risk of cervical cancer. Testing for HPV may also be done on women of any age who have unclear Pap test results.  Other health care providers may not recommend any screening for nonpregnant women who are considered low risk for pelvic cancer and have no symptoms. Ask your health care provider if a screening pelvic exam is right for you.  If you have had past treatment for cervical cancer or a condition that could lead to cancer, you need Pap tests and screening for cancer for at least 20 years after your treatment. If Pap tests have been discontinued for you, your risk factors (such as having a new sexual partner) need to be reassessed to determine if you should start having screenings again. Some women have medical problems that increase the chance of getting cervical cancer. In these cases, your health care provider may recommend that you have screening and Pap tests more often.  If you have a family history of uterine cancer or ovarian  cancer, talk with your health care provider about genetic screening.  If you have vaginal bleeding after reaching menopause, tell your health care provider.  There are currently no reliable tests available to screen for ovarian cancer.  Lung Cancer Lung cancer screening is recommended for adults 76-20 years old who are at high risk for lung cancer because of a history of smoking. A yearly low-dose CT scan of the lungs is recommended if you:  Currently smoke.  Have a history of at least 30 pack-years of smoking and you currently  smoke or have quit within the past 15 years. A pack-year is smoking an average of one pack of cigarettes per day for one year.  Yearly screening should:  Continue until it has been 15 years since you quit.  Stop if you develop a health problem that would prevent you from having lung cancer treatment.  Colorectal Cancer  This type of cancer can be detected and can often be prevented.  Routine colorectal cancer screening usually begins at age 11 and continues through age 21.  If you have risk factors for colon cancer, your health care provider may recommend that you be screened at an earlier age.  If you have a family history of colorectal cancer, talk with your health care provider about genetic screening.  Your health care provider may also recommend using home test kits to check for hidden blood in your stool.  A small camera at the end of a tube can be used to examine your colon directly (sigmoidoscopy or colonoscopy). This is done to check for the earliest forms of colorectal cancer.  Direct examination of the colon should be repeated every 5-10 years until age 46. However, if early forms of precancerous polyps or small growths are found or if you have a family history or genetic risk for colorectal cancer, you may need to be screened more often.  Skin Cancer  Check your skin from head to toe regularly.  Monitor any moles. Be sure to tell your health care provider: ? About any new moles or changes in moles, especially if there is a change in a mole's shape or color. ? If you have a mole that is larger than the size of a pencil eraser.  If any of your family members has a history of skin cancer, especially at a young age, talk with your health care provider about genetic screening.  Always use sunscreen. Apply sunscreen liberally and repeatedly throughout the day.  Whenever you are outside, protect yourself by wearing long sleeves, pants, a wide-brimmed hat, and  sunglasses.  What should I know about osteoporosis? Osteoporosis is a condition in which bone destruction happens more quickly than new bone creation. After menopause, you may be at an increased risk for osteoporosis. To help prevent osteoporosis or the bone fractures that can happen because of osteoporosis, the following is recommended:  If you are 70-5 years old, get at least 1,000 mg of calcium and at least 600 mg of vitamin D per day.  If you are older than age 2 but younger than age 15, get at least 1,200 mg of calcium and at least 600 mg of vitamin D per day.  If you are older than age 8, get at least 1,200 mg of calcium and at least 800 mg of vitamin D per day.  Smoking and excessive alcohol intake increase the risk of osteoporosis. Eat foods that are rich in calcium and vitamin D, and do weight-bearing exercises several times  each week as directed by your health care provider. What should I know about how menopause affects my mental health? Depression may occur at any age, but it is more common as you become older. Common symptoms of depression include:  Low or sad mood.  Changes in sleep patterns.  Changes in appetite or eating patterns.  Feeling an overall lack of motivation or enjoyment of activities that you previously enjoyed.  Frequent crying spells.  Talk with your health care provider if you think that you are experiencing depression. What should I know about immunizations? It is important that you get and maintain your immunizations. These include:  Tetanus, diphtheria, and pertussis (Tdap) booster vaccine.  Influenza every year before the flu season begins.  Pneumonia vaccine.  Shingles vaccine.  Your health care provider may also recommend other immunizations. This information is not intended to replace advice given to you by your health care provider. Make sure you discuss any questions you have with your health care provider. Document Released:  11/29/2005 Document Revised: 04/26/2016 Document Reviewed: 07/11/2015 Elsevier Interactive Patient Education  2018 [employer].

## 2017-11-17 NOTE — Telephone Encounter (Signed)
Referral placed at [facility], left on pt voicemail they will call her to schedule.

## 2017-11-17 NOTE — Telephone Encounter (Signed)
-----   Message from [name], MD sent at 11/14/2017  4:04 PM EST ----- Regarding: Refer for Colonoscopy Refer to GastroEnterologist (Not Dr Collene Mares please) for screening Colonoscopy.  Personal h/o Colon Polyp and Fam h/o Colon Ca.

## 2017-11-18 LAB — PAP, TP IMAGING W/ HPV RNA, RFLX HPV TYPE 16,18/45: HPV DNA HIGH RISK: NOT DETECTED

## 2017-11-20 NOTE — Telephone Encounter (Signed)
Pt scheduled on 01/02/18 @  9 with [facility]

## 2017-11-25 ENCOUNTER — Other Ambulatory Visit: Payer: Self-pay | Admitting: Gynecology

## 2017-11-25 DIAGNOSIS — Z13820 Encounter for screening for osteoporosis: Secondary | ICD-10-CM

## 2017-12-08 DIAGNOSIS — R52 Pain, unspecified: Secondary | ICD-10-CM | POA: Diagnosis not present

## 2017-12-08 DIAGNOSIS — Z20828 Contact with and (suspected) exposure to other viral communicable diseases: Secondary | ICD-10-CM | POA: Diagnosis not present

## 2017-12-08 DIAGNOSIS — B349 Viral infection, unspecified: Secondary | ICD-10-CM | POA: Diagnosis not present

## 2017-12-11 ENCOUNTER — Telehealth: Payer: Self-pay | Admitting: Gastroenterology

## 2017-12-11 NOTE — Telephone Encounter (Signed)
Records have been placed on Dr.Armbruster's desk for review.

## 2017-12-12 NOTE — Telephone Encounter (Signed)
Dr. Havery Moros reviewed records and has accepted patient. Ok to schedule OV. Appointment scheduled for 01/26/18

## 2017-12-29 ENCOUNTER — Other Ambulatory Visit: Payer: BLUE CROSS/BLUE SHIELD

## 2017-12-29 ENCOUNTER — Ambulatory Visit (INDEPENDENT_AMBULATORY_CARE_PROVIDER_SITE_OTHER): Payer: BLUE CROSS/BLUE SHIELD

## 2017-12-29 ENCOUNTER — Other Ambulatory Visit: Payer: Self-pay | Admitting: Gynecology

## 2017-12-29 DIAGNOSIS — M85851 Other specified disorders of bone density and structure, right thigh: Secondary | ICD-10-CM

## 2017-12-29 DIAGNOSIS — Z01419 Encounter for gynecological examination (general) (routine) without abnormal findings: Secondary | ICD-10-CM | POA: Diagnosis not present

## 2017-12-29 DIAGNOSIS — Z13820 Encounter for screening for osteoporosis: Secondary | ICD-10-CM

## 2017-12-29 DIAGNOSIS — M8588 Other specified disorders of bone density and structure, other site: Secondary | ICD-10-CM

## 2017-12-30 ENCOUNTER — Encounter: Payer: Self-pay | Admitting: Gynecology

## 2018-01-01 LAB — COMPREHENSIVE METABOLIC PANEL
AG RATIO: 1.7 (calc) (ref 1.0–2.5)
ALBUMIN MSPROF: 4.3 g/dL (ref 3.6–5.1)
ALT: 15 U/L (ref 6–29)
AST: 20 U/L (ref 10–35)
Alkaline phosphatase (APISO): 44 U/L (ref 33–130)
BILIRUBIN TOTAL: 0.5 mg/dL (ref 0.2–1.2)
BUN: 11 mg/dL (ref 7–25)
CALCIUM: 9.2 mg/dL (ref 8.6–10.4)
CHLORIDE: 99 mmol/L (ref 98–110)
CO2: 27 mmol/L (ref 20–32)
Creat: 0.62 mg/dL (ref 0.50–1.05)
GLOBULIN: 2.6 g/dL (ref 1.9–3.7)
GLUCOSE: 92 mg/dL (ref 65–99)
POTASSIUM: 4.5 mmol/L (ref 3.5–5.3)
Sodium: 134 mmol/L — ABNORMAL LOW (ref 135–146)
TOTAL PROTEIN: 6.9 g/dL (ref 6.1–8.1)

## 2018-01-01 LAB — CBC
HCT: 38.4 % (ref 35.0–45.0)
Hemoglobin: 13.3 g/dL (ref 11.7–15.5)
MCH: 31.4 pg (ref 27.0–33.0)
MCHC: 34.6 g/dL (ref 32.0–36.0)
MCV: 90.8 fL (ref 80.0–100.0)
MPV: 9.9 fL (ref 7.5–12.5)
PLATELETS: 246 10*3/uL (ref 140–400)
RBC: 4.23 10*6/uL (ref 3.80–5.10)
RDW: 12 % (ref 11.0–15.0)
WBC: 5.6 10*3/uL (ref 3.8–10.8)

## 2018-01-01 LAB — LIPID PANEL
CHOLESTEROL: 193 mg/dL (ref <200)
HDL: 87 mg/dL (ref >50)
LDL Cholesterol (Calc): 92 mg/dL (calc)
Non-HDL Cholesterol (Calc): 106 mg/dL (calc) (ref <130)
Total CHOL/HDL Ratio: 2.2 (calc) (ref <5.0)
Triglycerides: 56 mg/dL (ref <150)

## 2018-01-01 LAB — VITAMIN D 1,25 DIHYDROXY
VITAMIN D3 1, 25 (OH): 45 pg/mL
Vitamin D 1, 25 (OH)2 Total: 45 pg/mL (ref 18–72)

## 2018-01-01 LAB — TSH: TSH: 1.22 mIU/L

## 2018-01-02 ENCOUNTER — Ambulatory Visit: Payer: BLUE CROSS/BLUE SHIELD | Admitting: Gastroenterology

## 2018-01-26 ENCOUNTER — Encounter: Payer: Self-pay | Admitting: Gastroenterology

## 2018-01-26 ENCOUNTER — Ambulatory Visit: Payer: BLUE CROSS/BLUE SHIELD | Admitting: Gastroenterology

## 2018-01-26 VITALS — BP 124/82 | HR 70 | Ht 66.5 in | Wt 150.2 lb

## 2018-01-26 DIAGNOSIS — Z86010 Personal history of colonic polyps: Secondary | ICD-10-CM | POA: Diagnosis not present

## 2018-01-26 NOTE — Progress Notes (Signed)
HPI :  52 y/o female with a history of colon polyps, FH of colon cancer, endometriosis, here for a visit to discuss having a colonoscopy.  Her last colonoscopy was done by Dr. Alice Reichert in [facility] roughly 3 years ago. In the procedure note the prep is listed as good and a reported 4 mm ascending colon sessile serrated polyp was removed via hot snare. In the pathology report however, the specimen sent to the lab is listed as 1.4 cm in size. Dr. Alice Reichert had recommended a colonoscopy in 3 years after her  Last exam.   She has an extensive second-degree family of colon cancer. paternal uncle diagnosed with colon cancer at age 9. Maternal aunt diagnosed with colon  Cancer at age 52s. Paternal grandfather diagnosed with colon cancer at age 41s. Sister diagnosed with breast cancer.  The patient denies any trouble with her bowels.  She denies any abdominal pains.  She denies any blood in her stools.  She is eating well without any postprandial complaints.  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  She has never had a history of blood clots, she does not take any anticoagulants.  Colonoscopy 11/11/2014 - prep was "very good", 54mm polyp removed at ascending colon removed via hot snare - c/w sessile serrated polyp, small hemorrhoids. Path shows a (1.4cm polyp in size however)   Past Medical History:  Diagnosis Date  . Endometriosis   . HSV-2 (herpes simplex virus 2) infection   . 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 PVC's (premature ventricular contractions)      Past Surgical History:  Procedure Laterality Date  . BUNIONECTOMY     BOTH FEET  . HERNIA REPAIR    . INGUINAL HERNIA REPAIR     LEFT  . PELVIC LAPAROSCOPY     ENDOMETRIOSIS  . TONSILLECTOMY     Family History  Problem Relation Age of Onset  . Cancer Sister        breast  . Breast cancer Sister        75's  . Breast cancer Maternal Aunt        ? age of onset, young  .  Cancer Maternal Aunt        COLON  . Cancer Paternal Uncle        COLON  . Cancer Maternal Grandmother        OVARIAN  . Diabetes Paternal Grandmother   . Cancer Paternal Grandfather        COLON   Social History   Tobacco Use  . Smoking status: Former Smoker    Types: Cigarettes    Last attempt to quit: 01/18/2007    Years since quitting: 11.0  . Smokeless tobacco: Never Used  Substance Use Topics  . Alcohol use: Yes    Alcohol/week: 0.0 oz    Comment: 1-2 glasses of wine/day  . Drug use: No   Current Outpatient Medications  Medication Sig Dispense Refill  . Multiple Vitamin (MULTIVITAMIN) tablet Take 1 tablet by mouth daily.      . valACYclovir (VALTREX) 1000 MG tablet Take 0.5 tablets (500 mg total) by mouth daily. 45 tablet 4   No current facility-administered medications for this visit.    Allergies  Allergen Reactions  . Oxycodone-Acetaminophen Nausea And Vomiting  . Propoxyphene Nausea And Vomiting  . Codeine Nausea Only  . Diclofenac Nausea Only     Review of Systems: All systems reviewed and  negative except where noted in HPI.   Lab Results  Component Value Date   WBC 5.6 12/29/2017   HGB 13.3 12/29/2017   HCT 38.4 12/29/2017   MCV 90.8 12/29/2017   PLT 246 12/29/2017    Lab Results  Component Value Date   CREATININE 0.62 12/29/2017   BUN 11 12/29/2017   NA 134 (L) 12/29/2017   K 4.5 12/29/2017   CL 99 12/29/2017   CO2 27 12/29/2017    Lab Results  Component Value Date   ALT 15 12/29/2017   AST 20 12/29/2017   ALKPHOS 34 11/13/2016   BILITOT 0.5 12/29/2017     Physical Exam: BP 124/82   Pulse 70   Ht 5' 6.5" (1.689 m)   Wt 150 lb 4 oz (68.2 kg)   BMI 23.89 kg/m  Constitutional: Pleasant,well-developed, female in no acute distress. HEENT: Normocephalic and atraumatic. Conjunctivae are normal. No scleral icterus. Neck supple.  Cardiovascular: Normal rate, regular rhythm.  Pulmonary/chest: Effort normal and breath sounds normal.  No wheezing, rales or rhonchi. Abdominal: Soft, nondistended, nontender.There are no masses palpable. No hepatomegaly. Extremities: no edema Lymphadenopathy: No cervical adenopathy noted. Neurological: Alert and oriented to person place and time. Skin: Skin is warm and dry. No rashes noted. Psychiatric: Normal mood and affect. Behavior is normal.   ASSESSMENT AND PLAN: 52 year old female with strong second-degree family history of colon cancer, history of sessile serrated polyps, last exam done 3 years ago, here to discuss surveillance colonoscopy.  There is a discordance between the written report of a 0.4 cm polyp removed during her last exam, while the pathology result clearly shows a 1.4 cm specimen.  In this light, given she had a greater than 1 cm sessile serrated polyp removed on her last exam 3 years ago, she is due for surveillance colonoscopy at this time.  Discussed risks and benefits of colonoscopy with her and she want to proceed.  Further recommendations pending results of this exam.  [place]l [place] Cellar, MD [facility]

## 2018-01-26 NOTE — Patient Instructions (Signed)
If you are age 52 or older, your body mass index should be between 23-30. Your Body mass index is 23.89 kg/m. If this is out of the aforementioned range listed, please consider follow up with your Primary Care Provider.  If you are age 49 or younger, your body mass index should be between 19-25. Your Body mass index is 23.89 kg/m. If this is out of the aformentioned range listed, please consider follow up with your Primary Care Provider.   You have been scheduled for a colonoscopy. Please follow written instructions given to you at your visit today.  Please pick up your prep supplies at the pharmacy within the next 1-3 days. If you use inhalers (even only as needed), please bring them with you on the day of your procedure. Your physician has requested that you go to www.startemmi.com and enter the access code given to you at your visit today. This web site gives a general overview about your procedure. However, you should still follow specific instructions given to you by our office regarding your preparation for the procedure.  Thank you for entrusting me with your care and for choosing [facility], Dr. Jessamine Cellar

## 2018-02-16 ENCOUNTER — Encounter: Payer: Self-pay | Admitting: Gastroenterology

## 2018-03-02 ENCOUNTER — Encounter: Payer: BLUE CROSS/BLUE SHIELD | Admitting: Gastroenterology

## 2018-04-13 ENCOUNTER — Encounter: Payer: Self-pay | Admitting: Women's Health

## 2018-04-13 ENCOUNTER — Ambulatory Visit: Payer: BLUE CROSS/BLUE SHIELD | Admitting: Women's Health

## 2018-04-13 ENCOUNTER — Ambulatory Visit (INDEPENDENT_AMBULATORY_CARE_PROVIDER_SITE_OTHER): Payer: BLUE CROSS/BLUE SHIELD

## 2018-04-13 VITALS — BP 132/80

## 2018-04-13 DIAGNOSIS — R140 Abdominal distension (gaseous): Secondary | ICD-10-CM

## 2018-04-13 NOTE — Progress Notes (Signed)
52 year old S WF [id number] presents with complaint of low abdominal bloating, increased today, loss of appetite, quick satiety, no weight loss for the last month.  No change in routine, exercise or diet.  2 glasses of wine daily, no change.  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  Not sexually active in years, denies need for STD screen.  Denies urinary symptoms other than occasional pressure, vaginal discharge, constipation or diarrhea.  Normal BM yesterday.  Postmenopausal on no HRT with no bleeding.  Exam: Appears well, abdomen soft, nontender appears normal weight [place]: T/V  retroverted homogeneous uterus.  Endometrium within normal limits 2.6 mm.  Right and left ovary normal.  Right ovary 0.8 cc left ovary 1.1 cc.  Positive CFD bilateral ovaries.  Negative cul-de-sac.  No apparent mass left or right adnexal.  Abdominal bloating  Plan: Reviewed normality of ultrasound, ovaries small, endometrium thin.  Has a GI appointment in 2 weeks we will keep scheduled appointment.  History of colonoscopy with benign polyp years ago.  Reviewed possibly drinking only one  1 glass of wine daily or no wine to see if that may help bloating sensation.  Encouraged to increase regular exercise.Marland Kitchen

## 2018-04-15 ENCOUNTER — Telehealth: Payer: Self-pay | Admitting: Gastroenterology

## 2018-04-15 ENCOUNTER — Telehealth: Payer: Self-pay | Admitting: *Deleted

## 2018-04-15 NOTE — Telephone Encounter (Signed)
Patient called requesting urine results from [facility] on 04/13/18. I called and told her no urine was ran [place] did not order at visit. Pt said abdominal still bloated, c/o of not having bowel movement has tried laxative and small amount, but not her normal amount. She has appointment with GI on 04/16/18, I suggested she follow up with them.

## 2018-04-15 NOTE — Telephone Encounter (Signed)
Pt would like to speak with a nurse. Her sxs have worsened.

## 2018-04-15 NOTE — Telephone Encounter (Signed)
Routed to DOD, pt of Dr. Havery Moros: Spoke to patient, she has been having a lot of abdominal bloating, constipation, has tried 2 doses of Miralax. She took a Doculax suppository last night and did have a some relief and BM. Denies any fever, no vomiting. She is eating, advised her to go to a liquid diet, try 1/2 bottle of Magnesium Citrate tonight when she get home. She already had appointment with APP for tomorrow. Advised if symptoms worsen may need to go to the ER, otherwise keep appointment tomorrow.

## 2018-04-16 ENCOUNTER — Ambulatory Visit: Payer: BLUE CROSS/BLUE SHIELD | Admitting: Nurse Practitioner

## 2018-04-16 NOTE — Telephone Encounter (Signed)
Spoke to patient today, she states she drank the whole bottle of magnesium citrate last night and had good results. She is stating that she has been having difficulty with satiety and "just not hungry". Patient cancelled her appointment with APP today because she was feeling better, not constipated today but still not hungry, feels full even if she drinks water. I have rescheduled her to see the APP on Monday, patient also has colonoscopy on 7/10.

## 2018-04-16 NOTE — Telephone Encounter (Signed)
Thanks for letting me know Please let me know how she feels today

## 2018-04-16 NOTE — Telephone Encounter (Signed)
Thank you Julie

## 2018-04-20 ENCOUNTER — Ambulatory Visit: Payer: BLUE CROSS/BLUE SHIELD | Admitting: Nurse Practitioner

## 2018-04-29 ENCOUNTER — Other Ambulatory Visit: Payer: Self-pay

## 2018-04-29 ENCOUNTER — Ambulatory Visit (AMBULATORY_SURGERY_CENTER): Payer: BLUE CROSS/BLUE SHIELD | Admitting: Gastroenterology

## 2018-04-29 ENCOUNTER — Encounter: Payer: Self-pay | Admitting: Gastroenterology

## 2018-04-29 ENCOUNTER — Encounter

## 2018-04-29 VITALS — BP 108/54 | HR 60 | Temp 97.8°F | Resp 10 | Ht 66.0 in | Wt 150.0 lb

## 2018-04-29 DIAGNOSIS — Z86010 Personal history of colonic polyps: Secondary | ICD-10-CM | POA: Diagnosis not present

## 2018-04-29 DIAGNOSIS — D128 Benign neoplasm of rectum: Secondary | ICD-10-CM | POA: Diagnosis not present

## 2018-04-29 DIAGNOSIS — K621 Rectal polyp: Secondary | ICD-10-CM | POA: Diagnosis not present

## 2018-04-29 MED ORDER — SODIUM CHLORIDE 0.9 % IV SOLN: 500.0000 mL | Freq: Once | INTRAVENOUS | Status: DC

## 2018-04-29 NOTE — Patient Instructions (Signed)
YOU HAD AN ENDOSCOPIC PROCEDURE TODAY AT THE [facility]:   Refer to the procedure report that was given to you for any specific questions about what was found during the examination.  If the procedure report does not answer your questions, please call your gastroenterologist to clarify.  If you requested that your care partner not be given the details of your procedure findings, then the procedure report has been included in a sealed envelope for you to review at your convenience later.  YOU SHOULD EXPECT: Some feelings of bloating in the abdomen. Passage of more gas than usual.  Walking can help get rid of the air that was put into your GI tract during the procedure and reduce the bloating. If you had a lower endoscopy (such as a colonoscopy or flexible sigmoidoscopy) you may notice spotting of blood in your stool or on the toilet paper. If you underwent a bowel prep for your procedure, you may not have a normal bowel movement for a few days.  Please Note:  You might notice some irritation and congestion in your nose or some drainage.  This is from the oxygen used during your procedure.  There is no need for concern and it should clear up in a day or so.  SYMPTOMS TO REPORT IMMEDIATELY:   Following lower endoscopy (colonoscopy or flexible sigmoidoscopy):  Excessive amounts of blood in the stool  Significant tenderness or worsening of abdominal pains  Swelling of the abdomen that is new, acute  Fever of 100F or higher  Please see handouts given to you on Polyps and Diverticulosis.  For urgent or emergent issues, a gastroenterologist can be reached at any hour by calling [phone].   DIET:  We do recommend a small meal at first, but then you may proceed to your regular diet.  Drink plenty of fluids but you should avoid alcoholic beverages for 24 hours.  ACTIVITY:  You should plan to take it easy for the rest of today and you should NOT DRIVE or use heavy machinery until  tomorrow (because of the sedation medicines used during the test).    FOLLOW UP: Our staff will call the number listed on your records the next business day following your procedure to check on you and address any questions or concerns that you may have regarding the information given to you following your procedure. If we do not reach you, we will leave a message.  However, if you are feeling well and you are not experiencing any problems, there is no need to return our call.  We will assume that you have returned to your regular daily activities without incident.  If any biopsies were taken you will be contacted by phone or by letter within the next 1-3 weeks.  Please call us at [phone] if you have not heard about the biopsies in 3 weeks.    SIGNATURES/CONFIDENTIALITY: You and/or your care partner have signed paperwork which will be entered into your electronic medical record.  These signatures attest to the fact that that the information above on your After Visit Summary has been reviewed and is understood.  Full responsibility of the confidentiality of this discharge information lies with you and/or your care-partner.  Thank you for letting us take care of your healthcare needs today. 

## 2018-04-29 NOTE — Op Note (Signed)
[facility] Patient Name: Jessica [name] Procedure Date: 04/29/2018 10:36 AM MRN: [id number] Endoscopist: Remo Lipps P. Havery Moros , MD Age: 52 Referring MD:  Date of Birth: Mar 29, 1966 Gender: Female Account #: [id number] Procedure:                Colonoscopy Indications:              High risk colon cancer surveillance: Personal                            history of sessile serrated colon polyp (10 mm or                            greater in size) 3 years ago Medicines:                Monitored Anesthesia Care Procedure:                Pre-Anesthesia Assessment:                           - Prior to the procedure, a History and Physical                            was performed, and patient medications and                            allergies were reviewed. The patient's tolerance of                            previous anesthesia was also reviewed. The risks                            and benefits of the procedure and the sedation                            options and risks were discussed with the patient.                            All questions were answered, and informed consent                            was obtained. Prior Anticoagulants: The patient has                            taken no previous anticoagulant or antiplatelet                            agents. ASA Grade Assessment: II - A patient with                            mild systemic disease. After reviewing the risks                            and benefits, the patient was deemed in  satisfactory condition to undergo the procedure.                           After obtaining informed consent, the colonoscope                            was passed under direct vision. Throughout the                            procedure, the patient's blood pressure, pulse, and                            oxygen saturations were monitored continuously. The                            Model PCF-[id number]  [id number]) scope was introduced                            through the anus and advanced to the the cecum,                            identified by appendiceal orifice and ileocecal                            valve. The colonoscopy was performed without                            difficulty. The patient tolerated the procedure                            well. The quality of the bowel preparation was                            good. The ileocecal valve, appendiceal orifice, and                            rectum were photographed. Scope In: 10:41:53 AM Scope Out: 11:10:34 AM Scope Withdrawal Time: 0 hours 22 minutes 16 seconds  Total Procedure Duration: 0 hours 28 minutes 41 seconds  Findings:                 The perianal and digital rectal examinations were                            normal.                           Three sessile polyps were found in the rectum. The                            polyps were 3 to 4 mm in size. These polyps were                            removed with a cold snare. Resection and retrieval  were complete.                           The colon was tortuous which prolonged the exam.                           A few small-mouthed diverticula were found in the                            sigmoid colon.                           The exam was otherwise without abnormality. Complications:            No immediate complications. Estimated blood loss:                            Minimal. Estimated Blood Loss:     Estimated blood loss was minimal. Impression:               - Three 3 to 4 mm polyps in the rectum, removed                            with a cold snare. Resected and retrieved.                           - Tortuous colon.                           - Diverticulosis in the sigmoid colon.                           - The examination was otherwise normal. Recommendation:           - Patient has a contact number available for                             emergencies. The signs and symptoms of potential                            delayed complications were discussed with the                            patient. Return to normal activities tomorrow.                            Written discharge instructions were provided to the                            patient.                           - Resume previous diet.                           - Continue present medications.                           -  Await pathology results.                           - Repeat colonoscopy for surveillance based on                            pathology results. Remo Lipps P. Armbruster, MD 04/29/2018 11:14:29 AM This report has been signed electronically.

## 2018-04-29 NOTE — Progress Notes (Signed)
To PACU, VSS. Report to [email or web address] 

## 2018-04-29 NOTE — Progress Notes (Signed)
Called to room to assist during endoscopic procedure.  Patient ID and intended procedure confirmed with present staff. Received instructions for my participation in the procedure from the performing physician.  

## 2018-04-30 ENCOUNTER — Telehealth: Payer: Self-pay

## 2018-04-30 NOTE — Telephone Encounter (Signed)
  Follow up Call-  Call back number 04/29/2018  Post procedure Call Back phone  # [phone]  Permission to leave phone message Yes  Some recent data might be hidden     Patient questions:  Do you have a fever, pain , or abdominal swelling? No. Pain Score  0 *  Have you tolerated food without any problems? Yes.    Have you been able to return to your normal activities? Yes.    Do you have any questions about your discharge instructions: Diet   No. Medications  No. Follow up visit  No.  Do you have questions or concerns about your Care? No.  Actions: * If pain score is 4 or above: No action needed, pain <4.

## 2018-04-30 NOTE — Telephone Encounter (Signed)
Okay, thanks for letting me know

## 2018-05-20 DIAGNOSIS — M9901 Segmental and somatic dysfunction of cervical region: Secondary | ICD-10-CM | POA: Diagnosis not present

## 2018-05-20 DIAGNOSIS — M9903 Segmental and somatic dysfunction of lumbar region: Secondary | ICD-10-CM | POA: Diagnosis not present

## 2018-05-20 DIAGNOSIS — M47812 Spondylosis without myelopathy or radiculopathy, cervical region: Secondary | ICD-10-CM | POA: Diagnosis not present

## 2018-05-20 DIAGNOSIS — M5136 Other intervertebral disc degeneration, lumbar region: Secondary | ICD-10-CM | POA: Diagnosis not present

## 2018-09-16 DIAGNOSIS — H1045 Other chronic allergic conjunctivitis: Secondary | ICD-10-CM | POA: Diagnosis not present

## 2018-09-29 ENCOUNTER — Other Ambulatory Visit: Payer: Self-pay | Admitting: Obstetrics & Gynecology

## 2018-09-29 DIAGNOSIS — Z1231 Encounter for screening mammogram for malignant neoplasm of breast: Secondary | ICD-10-CM

## 2018-10-10 DIAGNOSIS — R6889 Other general symptoms and signs: Secondary | ICD-10-CM | POA: Diagnosis not present

## 2018-10-10 DIAGNOSIS — J029 Acute pharyngitis, unspecified: Secondary | ICD-10-CM | POA: Diagnosis not present

## 2018-10-10 DIAGNOSIS — B349 Viral infection, unspecified: Secondary | ICD-10-CM | POA: Diagnosis not present

## 2018-11-09 ENCOUNTER — Ambulatory Visit
Admission: RE | Admit: 2018-11-09 | Discharge: 2018-11-09 | Disposition: A | Discharge status: Home / Self Care | Payer: BLUE CROSS/BLUE SHIELD | Source: Ambulatory Visit | Attending: Obstetrics & Gynecology | Admitting: Obstetrics & Gynecology

## 2018-11-09 DIAGNOSIS — Z1231 Encounter for screening mammogram for malignant neoplasm of breast: Secondary | ICD-10-CM

## 2018-11-18 ENCOUNTER — Encounter: Payer: Self-pay | Admitting: Obstetrics & Gynecology

## 2018-11-18 ENCOUNTER — Ambulatory Visit (INDEPENDENT_AMBULATORY_CARE_PROVIDER_SITE_OTHER): Payer: BLUE CROSS/BLUE SHIELD | Admitting: Obstetrics & Gynecology

## 2018-11-18 VITALS — BP 124/80 | Ht 66.0 in | Wt 153.0 lb

## 2018-11-18 DIAGNOSIS — M85852 Other specified disorders of bone density and structure, left thigh: Secondary | ICD-10-CM | POA: Diagnosis not present

## 2018-11-18 DIAGNOSIS — Z780 Asymptomatic menopausal state: Secondary | ICD-10-CM

## 2018-11-18 DIAGNOSIS — Z8619 Personal history of other infectious and parasitic diseases: Secondary | ICD-10-CM

## 2018-11-18 DIAGNOSIS — Z01419 Encounter for gynecological examination (general) (routine) without abnormal findings: Secondary | ICD-10-CM

## 2018-11-18 NOTE — Progress Notes (Signed)
Jessica [name] Dec 12, 1965 [id number]   History:    53 y.o. G2P1A1L1 Single.  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  RP:  Established patient presenting for annual gyn exam   HPI: Menopause, well on no HRT.  No PMB.  No pelvic pain.  Abstinent since last Annual/Gyn exam.  Breasts normal.  BMI 24.69.  Will f/u here for Fasting Health Labs.  Past medical history,surgical history, family history and social history were all reviewed and documented in the EPIC chart.  Gynecologic History Patient's last menstrual period was 10/15/2014. Contraception: abstinence and post menopausal status Last Pap: 10/2017. Results were: Negative, HPV HR neg Last mammogram: 10/2018. Results were: Negative Bone Density: 12/2017 Osteopenia -1.2 Colonoscopy: 04/2018 Hyperplastic polyps.  Repeat Colono at 5 yrs.  Obstetric History OB History  Gravida Para Term Preterm AB Living  _0 SAB TAB Ectopic Multiple Live Births  1            # Outcome Date GA Lbr Len/2nd Weight Sex Delivery Anes PTL Lv  2 SAB           1 Para     M Vag-Spont        ROS: A ROS was performed and pertinent positives and negatives are included in the history.  GENERAL: No fevers or chills. HEENT: No change in vision, no earache, sore throat or sinus congestion. NECK: No pain or stiffness. CARDIOVASCULAR: No chest pain or pressure. No palpitations. PULMONARY: No shortness of breath, cough or wheeze. GASTROINTESTINAL: No abdominal pain, nausea, vomiting or diarrhea, melena or bright red blood per rectum. GENITOURINARY: No urinary frequency, urgency, hesitancy or dysuria. MUSCULOSKELETAL: No joint or muscle pain, no back pain, no recent trauma. DERMATOLOGIC: No rash, no itching, no lesions. ENDOCRINE: No polyuria, polydipsia, no heat or cold intolerance. No recent change in weight. HEMATOLOGICAL: No anemia or easy bruising or bleeding. NEUROLOGIC: No headache, seizures, numbness, tingling or weakness. PSYCHIATRIC: No depression,  no loss of interest in normal activity or change in sleep pattern.     Exam:   BP 124/80 (BP Location: Right Arm, Patient Position: Sitting, Cuff Size: Normal)   Ht _1  (1.676 m)   Wt 153 lb (69.4 kg)   LMP 10/15/2014   BMI 24.69 kg/m   Body mass index is 24.69 kg/m.  General appearance : Well developed well nourished female. No acute distress HEENT: Eyes: no retinal hemorrhage or exudates,  Neck supple, trachea midline, no carotid bruits, no thyroidmegaly Lungs: Clear to auscultation, no rhonchi or wheezes, or rib retractions  Heart: Regular rate and rhythm, no murmurs or gallops Breast:Examined in sitting and supine position were symmetrical in appearance, no palpable masses or tenderness,  no skin retraction, no nipple inversion, no nipple discharge, no skin discoloration, no axillary or supraclavicular lymphadenopathy Abdomen: no palpable masses or tenderness, no rebound or guarding Extremities: no edema or skin discoloration or tenderness  Pelvic: Vulva: Normal             Vagina: No gross lesions or discharge.  Nodule at Rt vaginal fornix unchanged.  Cervix: No gross lesions or discharge  Uterus  Av, normal size, shape and consistency, non-tender and mobile  Adnexa  Without masses or tenderness  Anus: Normal   Assessment/Plan:  53 y.o. female for annual exam   1. Well female exam with routine gynecological exam Normal gynecologic exam.  Pap test in January 2019 was negative with negative  high-risk HPV.  Patient is abstinent.  Will repeat Pap test at 3 years.  Breast exam normal.  Last screening mammogram January 2020 was negative.  Patient will follow-up for fasting labs here. - CBC; Future - TSH; Future - Lipid panel; Future - Comp Met (CMET); Future  2. Postmenopausal Well on no hormone replacement therapy.  No postmenopausal bleeding.  3. Osteopenia of neck of left femur Last bone density March 2019 showed osteopenia with a T score of -1.2.  Vitamin D  supplements with calcium intake of 1.2 to 1.5 g/day including nutritional and supplemental, regular weightbearing physical activities recommended.  Will repeat bone density in March 2021.  4. H/O herpes genitalis Will treat recurrences.  Doesn't need a represcription at this time.    (Do not print the summary at patient's request)  [name] MD, 4:16 PM 11/18/2018

## 2018-11-26 ENCOUNTER — Encounter: Payer: Self-pay | Admitting: Obstetrics & Gynecology

## 2019-06-04 DIAGNOSIS — H00014 Hordeolum externum left upper eyelid: Secondary | ICD-10-CM | POA: Diagnosis not present

## 2019-07-08 ENCOUNTER — Other Ambulatory Visit: Payer: Self-pay

## 2019-07-08 ENCOUNTER — Other Ambulatory Visit: Payer: BC Managed Care – PPO

## 2019-07-08 DIAGNOSIS — Z01419 Encounter for gynecological examination (general) (routine) without abnormal findings: Secondary | ICD-10-CM

## 2019-07-08 DIAGNOSIS — R7309 Other abnormal glucose: Secondary | ICD-10-CM | POA: Diagnosis not present

## 2019-07-10 LAB — CBC
HCT: 38.8 % (ref 35.0–45.0)
Hemoglobin: 13.2 g/dL (ref 11.7–15.5)
MCH: 31 pg (ref 27.0–33.0)
MCHC: 34 g/dL (ref 32.0–36.0)
MCV: 91.1 fL (ref 80.0–100.0)
MPV: 10.6 fL (ref 7.5–12.5)
Platelets: 195 10*3/uL (ref 140–400)
RBC: 4.26 10*6/uL (ref 3.80–5.10)
RDW: 11.8 % (ref 11.0–15.0)
WBC: 5 10*3/uL (ref 3.8–10.8)

## 2019-07-10 LAB — COMPREHENSIVE METABOLIC PANEL
AG Ratio: 2 (calc) (ref 1.0–2.5)
ALT: 17 U/L (ref 6–29)
AST: 21 U/L (ref 10–35)
Albumin: 4.5 g/dL (ref 3.6–5.1)
Alkaline phosphatase (APISO): 40 U/L (ref 37–153)
BUN: 14 mg/dL (ref 7–25)
CO2: 29 mmol/L (ref 20–32)
Calcium: 9.3 mg/dL (ref 8.6–10.4)
Chloride: 103 mmol/L (ref 98–110)
Creat: 0.67 mg/dL (ref 0.50–1.05)
Globulin: 2.2 g/dL (calc) (ref 1.9–3.7)
Glucose, Bld: 100 mg/dL — ABNORMAL HIGH (ref 65–99)
Potassium: 4.9 mmol/L (ref 3.5–5.3)
Sodium: 138 mmol/L (ref 135–146)
Total Bilirubin: 0.3 mg/dL (ref 0.2–1.2)
Total Protein: 6.7 g/dL (ref 6.1–8.1)

## 2019-07-10 LAB — LIPID PANEL
Cholesterol: 185 mg/dL (ref <200)
HDL: 90 mg/dL (ref >=50)
LDL Cholesterol (Calc): 82 mg/dL (calc)
Non-HDL Cholesterol (Calc): 95 mg/dL (calc) (ref <130)
Total CHOL/HDL Ratio: 2.1 (calc) (ref <5.0)
Triglycerides: 47 mg/dL (ref <150)

## 2019-07-10 LAB — TEST AUTHORIZATION

## 2019-07-10 LAB — TSH: TSH: 1.06 mIU/L

## 2019-07-10 LAB — HEMOGLOBIN A1C W/OUT EAG: Hgb A1c MFr Bld: 5.1 % of total Hgb (ref <5.7)

## 2019-07-14 ENCOUNTER — Encounter: Payer: Self-pay | Admitting: Gynecology

## 2019-07-29 IMAGING — MG 2D DIGITAL SCREENING BILATERAL MAMMOGRAM WITH 3D TOMO WITH CAD
9 of 12 series · 9 of 28 positions shown · non-contrast
Comparison: Previous exam(s).

CLINICAL DATA: Screening.

EXAM:
2D DIGITAL SCREENING BILATERAL MAMMOGRAM WITH 3D TOMO WITH CAD

[R MLO synth-2D]
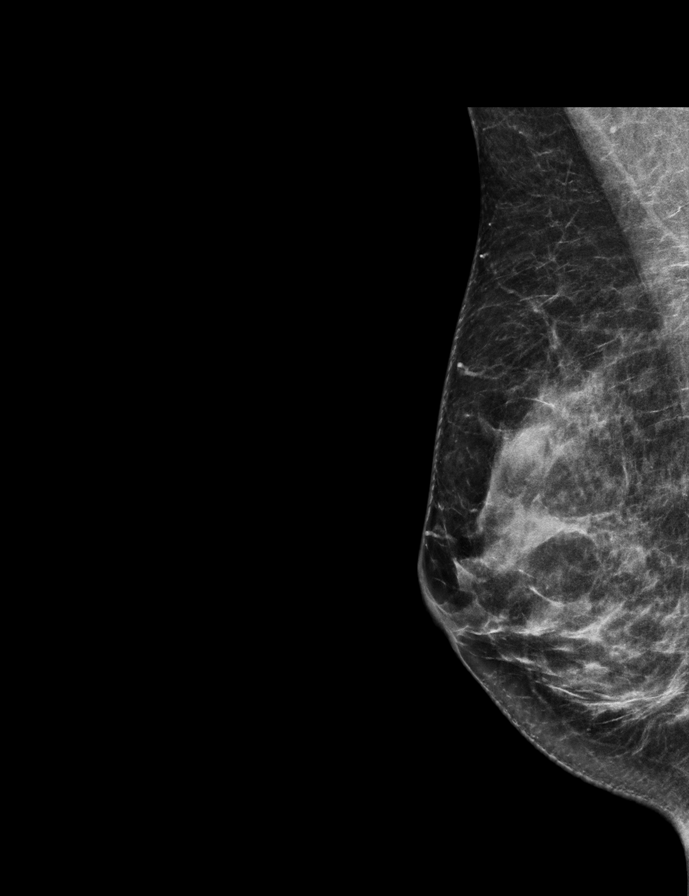

[L MLO]
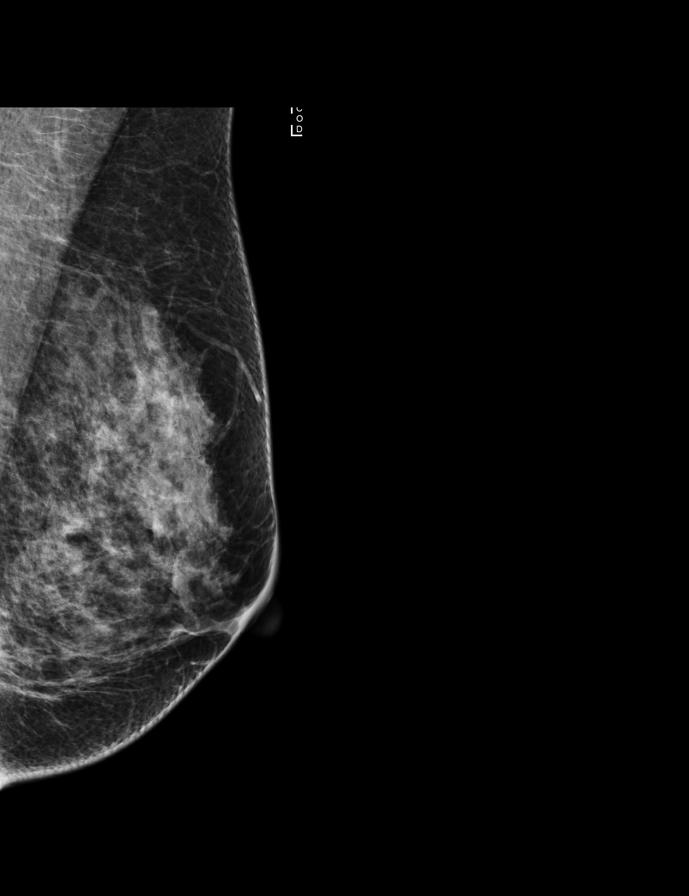

[R CC synth-2D]
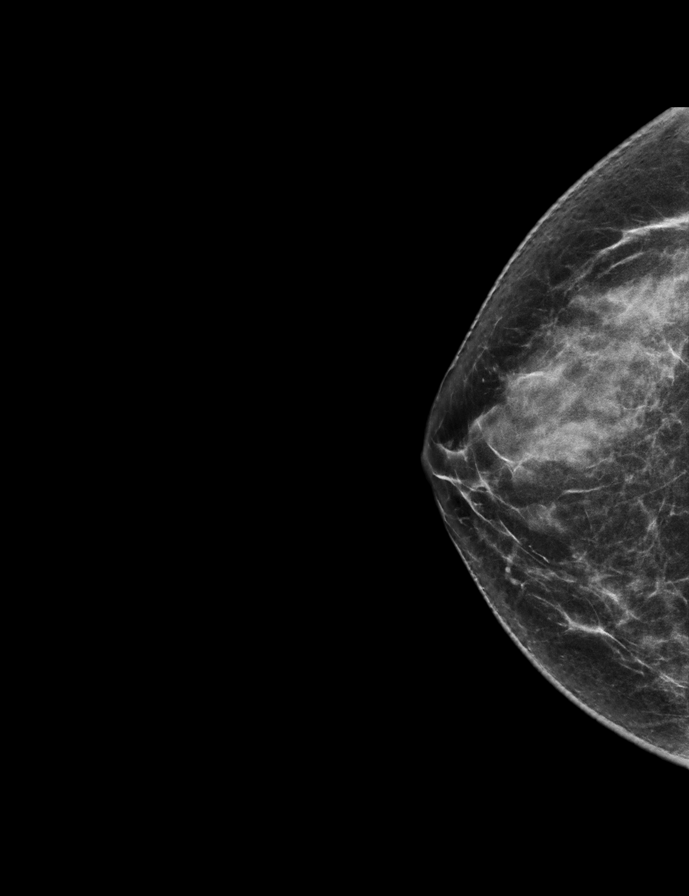

[L CC synth-2D]
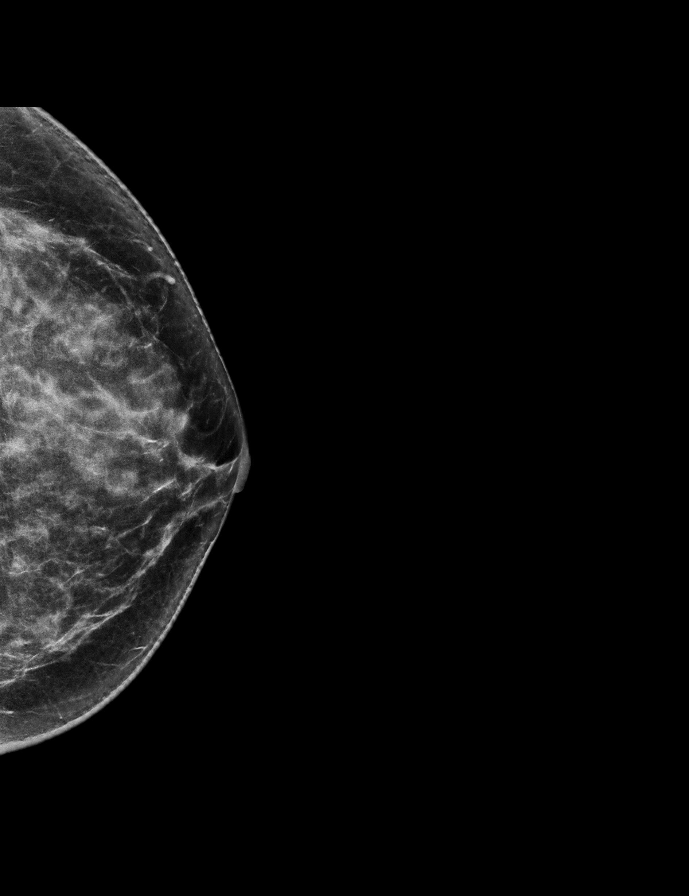

[R CC]
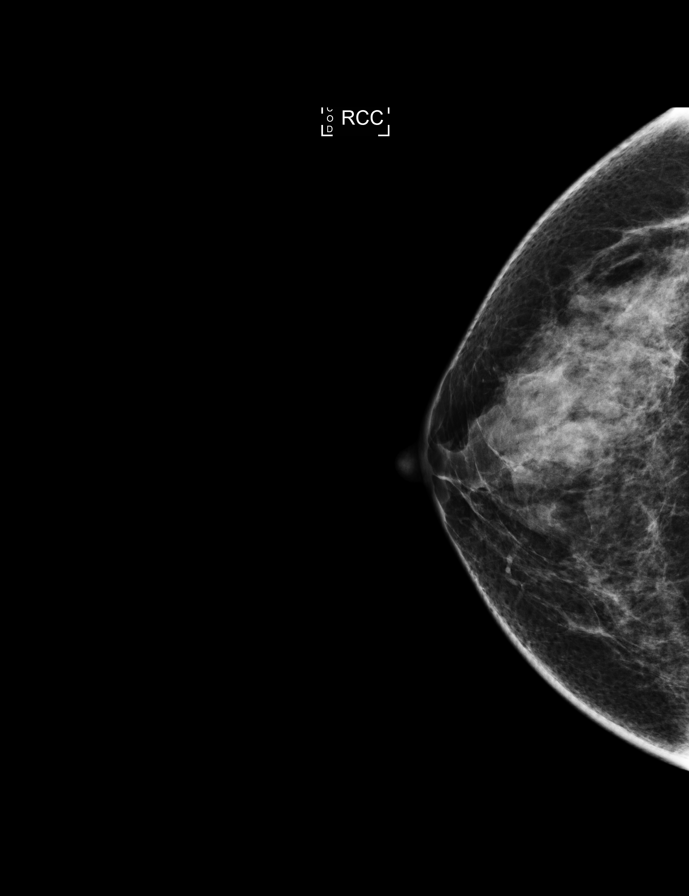

[L MLO synth-2D]
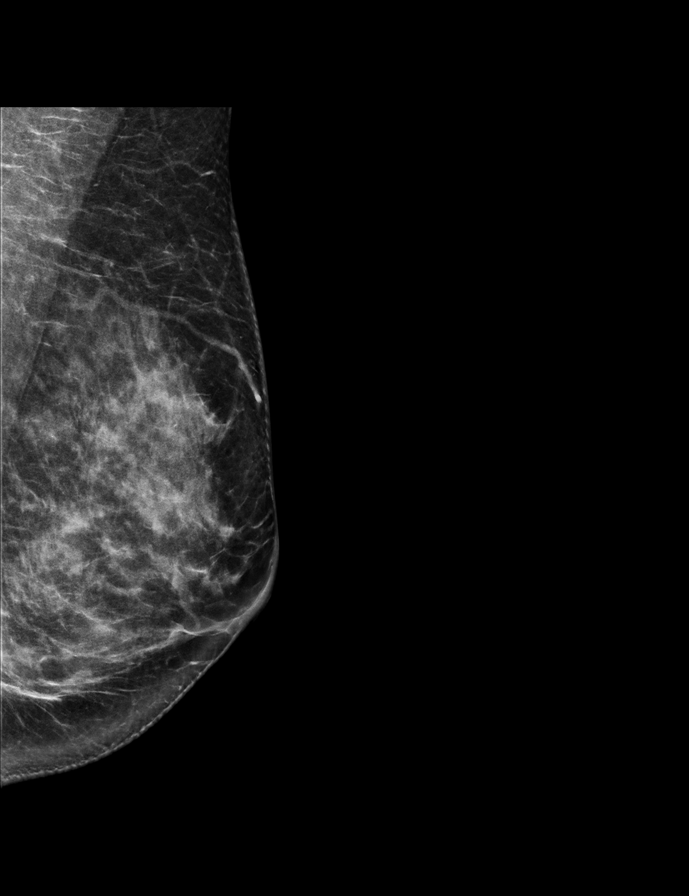

[R MLO]
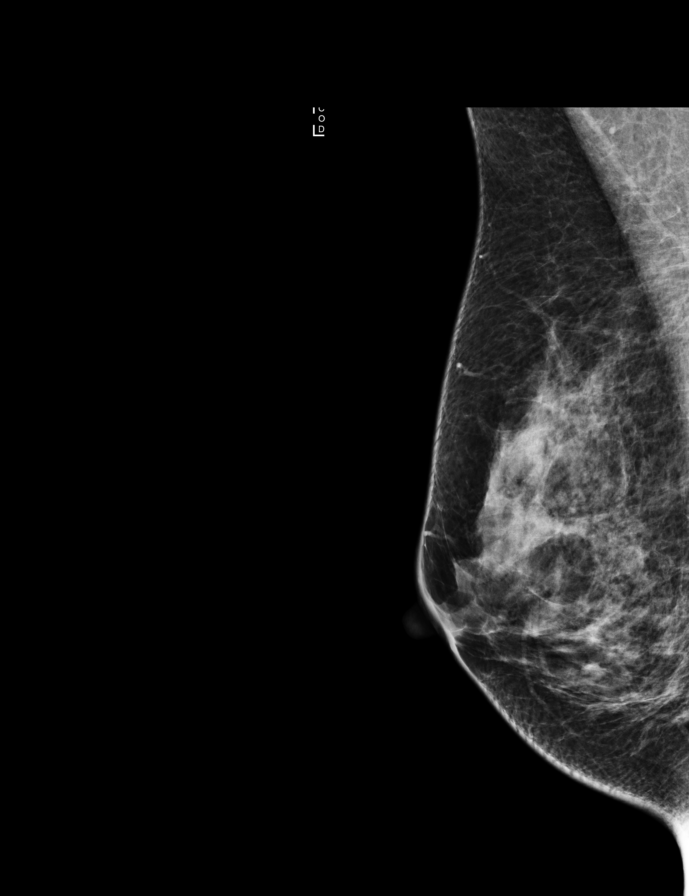

[L CC]
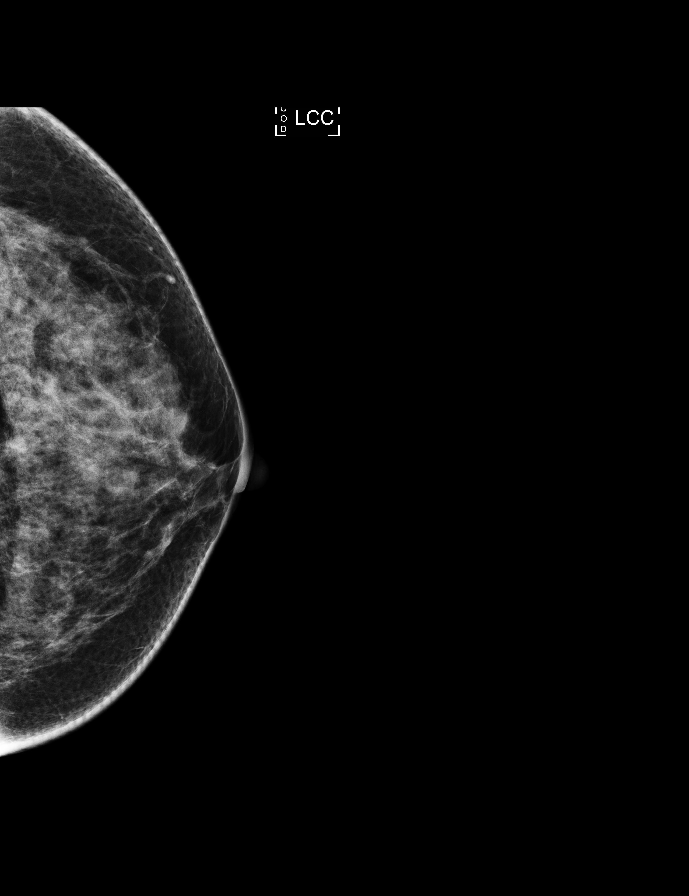

[R CC tomo · tomo slice 31/62.0]
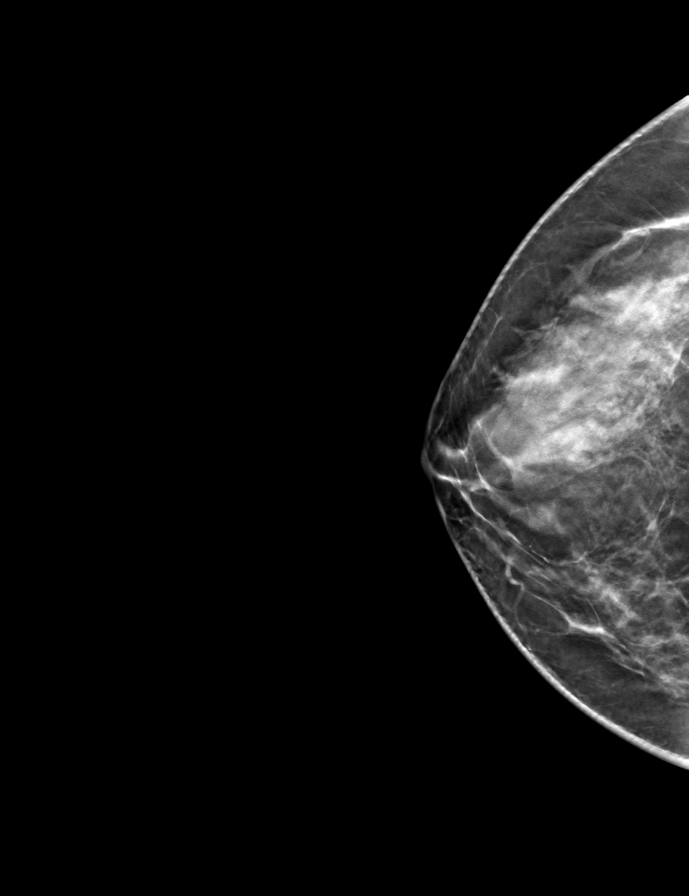

[9 of 28 positions shown; findings below may reference images not displayed]

ACR Breast Density Category d: The breast tissue is extremely dense,
which lowers the sensitivity of mammography.
FINDINGS: There are no findings suspicious for malignancy. Images were
processed with CAD.
IMPRESSION: No mammographic evidence of malignancy. A result letter of this
screening mammogram will be mailed directly to the patient.

RECOMMENDATION:
Screening mammogram in one year. (Code:L7-0-D4H)

BI-RADS CATEGORY  1: Negative.

## 2019-08-12 DIAGNOSIS — R509 Fever, unspecified: Secondary | ICD-10-CM | POA: Diagnosis not present

## 2019-08-12 DIAGNOSIS — Z20828 Contact with and (suspected) exposure to other viral communicable diseases: Secondary | ICD-10-CM | POA: Diagnosis not present

## 2019-08-12 DIAGNOSIS — R5383 Other fatigue: Secondary | ICD-10-CM | POA: Diagnosis not present

## 2019-08-12 DIAGNOSIS — R52 Pain, unspecified: Secondary | ICD-10-CM | POA: Diagnosis not present

## 2019-09-09 ENCOUNTER — Telehealth: Payer: Self-pay | Admitting: *Deleted

## 2019-09-09 NOTE — Telephone Encounter (Signed)
Patient called requesting Hemoglobin A1C results from 07/08/19 I called and left detailed message on cell per DPR access that result is normal and Dr.Lavoie recommend low sugar diet as noted on glucose result.

## 2019-09-30 ENCOUNTER — Other Ambulatory Visit: Payer: Self-pay

## 2019-09-30 DIAGNOSIS — D235 Other benign neoplasm of skin of trunk: Secondary | ICD-10-CM | POA: Diagnosis not present

## 2019-09-30 DIAGNOSIS — D234 Other benign neoplasm of skin of scalp and neck: Secondary | ICD-10-CM | POA: Diagnosis not present

## 2019-11-19 ENCOUNTER — Other Ambulatory Visit: Payer: Self-pay

## 2019-11-22 ENCOUNTER — Other Ambulatory Visit: Payer: Self-pay | Admitting: Obstetrics & Gynecology

## 2019-11-22 ENCOUNTER — Encounter: Payer: Self-pay | Admitting: Obstetrics & Gynecology

## 2019-11-22 ENCOUNTER — Other Ambulatory Visit: Payer: Self-pay

## 2019-11-22 ENCOUNTER — Ambulatory Visit (INDEPENDENT_AMBULATORY_CARE_PROVIDER_SITE_OTHER): Payer: BC Managed Care – PPO | Admitting: Obstetrics & Gynecology

## 2019-11-22 VITALS — BP 126/80 | Ht 64.0 in | Wt 159.0 lb

## 2019-11-22 DIAGNOSIS — E663 Overweight: Secondary | ICD-10-CM

## 2019-11-22 DIAGNOSIS — M8588 Other specified disorders of bone density and structure, other site: Secondary | ICD-10-CM

## 2019-11-22 DIAGNOSIS — Z01419 Encounter for gynecological examination (general) (routine) without abnormal findings: Secondary | ICD-10-CM

## 2019-11-22 DIAGNOSIS — Z780 Asymptomatic menopausal state: Secondary | ICD-10-CM

## 2019-11-22 DIAGNOSIS — Z1231 Encounter for screening mammogram for malignant neoplasm of breast: Secondary | ICD-10-CM

## 2019-11-22 DIAGNOSIS — Z1329 Encounter for screening for other suspected endocrine disorder: Secondary | ICD-10-CM | POA: Diagnosis not present

## 2019-11-22 DIAGNOSIS — Z13220 Encounter for screening for lipoid disorders: Secondary | ICD-10-CM | POA: Diagnosis not present

## 2019-11-22 DIAGNOSIS — M85852 Other specified disorders of bone density and structure, left thigh: Secondary | ICD-10-CM

## 2019-11-22 DIAGNOSIS — Z1321 Encounter for screening for nutritional disorder: Secondary | ICD-10-CM | POA: Diagnosis not present

## 2019-11-22 NOTE — Patient Instructions (Signed)
1. Well female exam with routine gynecological exam Normal gynecologic exam in menopause.  Pap test January 2019 was negative with negative high-risk HPV, will repeat Pap test next year.  Breast exam normal.  Screening mammogram January 2020 was negative, patient will schedule now.  Colonoscopy 2019.  Health labs here today. - CBC - Comp Met (CMET) - Lipid panel - TSH - VITAMIN D 25 Hydroxy (Vit-D Deficiency, Fractures) - Hemoglobin A1C  2. Postmenopausal Well on no hormone replacement therapy.  No postmenopausal bleeding.  3. Osteopenia of neck of left femur Very mild osteopenia only at the left femoral neck on bone density March 2019.  We will repeat a bone density at 3 years.  Vitamin D supplements, calcium intake of 1200 mg daily and regular weightbearing physical activities recommended.    4. Overweight (BMI 25.0-29.9) Mild increase in bone density into the overweight range this year.  Recommend a slightly lower calorie/carb diet such as [employer].  Continue with fitness activities, aerobic activities 5 times a week and light weightlifting every 2 days recommended.  Continue with yoga.  [place], it was a pleasure seeing you today!  I will inform you of your results as soon as they are available.

## 2019-11-22 NOTE — Progress Notes (Signed)
  Jessica [name] 08/01/1966 [id number]   History:    53 y.o. G2P1A1L1 Single.  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  RP:  Established patient presenting for annual gyn exam   HPI: Menopause, well on no HRT.  No PMB.  No pelvic pain.  Abstinent since last Annual/Gyn exam.  Pap Neg/HPV HR neg 10/2017. Breasts normal.  Mammo neg 10/2018.  BMI 24.69 last yr, now increased to 27.29.  Colono 2019.  BD 12/2017 mild Osteopenia Lt Femur Neck. Fasting Health Labs here today.   Past medical history,surgical history, family history and social history were all reviewed and documented in the EPIC chart.  Gynecologic History Patient's last menstrual period was 10/15/2014.  Obstetric History OB History  Gravida Para Term Preterm AB Living  3 2 1   1 1  SAB TAB Ectopic Multiple Live Births  1            # Outcome Date GA Lbr Len/2nd Weight Sex Delivery Anes PTL Lv  3 Term           2 SAB           1 Para     M Vag-Spont        ROS: A ROS was performed and pertinent positives and negatives are included in the history.  GENERAL: No fevers or chills. HEENT: No change in vision, no earache, sore throat or sinus congestion. NECK: No pain or stiffness. CARDIOVASCULAR: No chest pain or pressure. No palpitations. PULMONARY: No shortness of breath, cough or wheeze. GASTROINTESTINAL: No abdominal pain, nausea, vomiting or diarrhea, melena or bright red blood per rectum. GENITOURINARY: No urinary frequency, urgency, hesitancy or dysuria. MUSCULOSKELETAL: No joint or muscle pain, no back pain, no recent trauma. DERMATOLOGIC: No rash, no itching, no lesions. ENDOCRINE: No polyuria, polydipsia, no heat or cold intolerance. No recent change in weight. HEMATOLOGICAL: No anemia or easy bruising or bleeding. NEUROLOGIC: No headache, seizures, numbness, tingling or weakness. PSYCHIATRIC: No depression, no loss of interest in normal activity or change in sleep pattern.     Exam:   BP 126/80 (BP Location: Right Arm,  Patient Position: Sitting, Cuff Size: Normal)   Ht 5' 4" (1.626 m)   Wt 159 lb (72.1 kg)   LMP 10/15/2014   BMI 27.29 kg/m   Body mass index is 27.29 kg/m.  General appearance : Well developed well nourished female. No acute distress HEENT: Eyes: no retinal hemorrhage or exudates,  Neck supple, trachea midline, no carotid bruits, no thyroidmegaly Lungs: Clear to auscultation, no rhonchi or wheezes, or rib retractions  Heart: Regular rate and rhythm, no murmurs or gallops Breast:Examined in sitting and supine position were symmetrical in appearance, no palpable masses or tenderness,  no skin retraction, no nipple inversion, no nipple discharge, no skin discoloration, no axillary or supraclavicular lymphadenopathy Abdomen: no palpable masses or tenderness, no rebound or guarding Extremities: no edema or skin discoloration or tenderness  Pelvic: Vulva: Normal             Vagina: No gross lesions or discharge.  Small 1 cm round, soft cyst at upper Rt vaginal wall.  Cervix: No gross lesions or discharge  Uterus  AV, normal size, shape and consistency, non-tender and mobile  Adnexa  Without masses or tenderness  Anus: Normal   Assessment/Plan:  53 y.o. female for annual exam   1. Well female exam with routine gynecological exam Normal gynecologic exam in menopause.  Pap test January 2019 was   negative with negative high-risk HPV, will repeat Pap test next year.  Breast exam normal.  Screening mammogram January 2020 was negative, patient will schedule now.  Colonoscopy 2019.  Health labs here today. - CBC - Comp Met (CMET) - Lipid panel - TSH - VITAMIN D 25 Hydroxy (Vit-D Deficiency, Fractures) - Hemoglobin A1C  2. Postmenopausal Well on no hormone replacement therapy.  No postmenopausal bleeding.  3. Osteopenia of neck of left femur Very mild osteopenia only at the left femoral neck on bone density March 2019.  We will repeat a bone density at 3 years.  Vitamin D supplements,  calcium intake of 1200 mg daily and regular weightbearing physical activities recommended.    4. Overweight (BMI 25.0-29.9) Mild increase in bone density into the overweight range this year.  Recommend a slightly lower calorie/carb diet such as [employer].  Continue with fitness activities, aerobic activities 5 times a week and light weightlifting every 2 days recommended.  Continue with yoga.  [name] MD, 8:45 AM 11/22/2019

## 2019-11-23 LAB — COMPREHENSIVE METABOLIC PANEL
AG Ratio: 1.8 (calc) (ref 1.0–2.5)
ALT: 19 U/L (ref 6–29)
AST: 22 U/L (ref 10–35)
Albumin: 4.6 g/dL (ref 3.6–5.1)
Alkaline phosphatase (APISO): 41 U/L (ref 37–153)
BUN: 11 mg/dL (ref 7–25)
CO2: 30 mmol/L (ref 20–32)
Calcium: 9.4 mg/dL (ref 8.6–10.4)
Chloride: 103 mmol/L (ref 98–110)
Creat: 0.59 mg/dL (ref 0.50–1.05)
Globulin: 2.6 g/dL (calc) (ref 1.9–3.7)
Glucose, Bld: 107 mg/dL — ABNORMAL HIGH (ref 65–99)
Potassium: 4.3 mmol/L (ref 3.5–5.3)
Sodium: 140 mmol/L (ref 135–146)
Total Bilirubin: 0.4 mg/dL (ref 0.2–1.2)
Total Protein: 7.2 g/dL (ref 6.1–8.1)

## 2019-11-23 LAB — LIPID PANEL
Cholesterol: 209 mg/dL — ABNORMAL HIGH (ref <200)
HDL: 92 mg/dL (ref >=50)
LDL Cholesterol (Calc): 103 mg/dL (calc) — ABNORMAL HIGH
Non-HDL Cholesterol (Calc): 117 mg/dL (calc) (ref <130)
Total CHOL/HDL Ratio: 2.3 (calc) (ref <5.0)
Triglycerides: 56 mg/dL (ref <150)

## 2019-11-23 LAB — CBC
HCT: 40.6 % (ref 35.0–45.0)
Hemoglobin: 13.7 g/dL (ref 11.7–15.5)
MCH: 30.8 pg (ref 27.0–33.0)
MCHC: 33.7 g/dL (ref 32.0–36.0)
MCV: 91.2 fL (ref 80.0–100.0)
MPV: 10.5 fL (ref 7.5–12.5)
Platelets: 193 10*3/uL (ref 140–400)
RBC: 4.45 10*6/uL (ref 3.80–5.10)
RDW: 11.9 % (ref 11.0–15.0)
WBC: 5.5 10*3/uL (ref 3.8–10.8)

## 2019-11-23 LAB — HEMOGLOBIN A1C
Hgb A1c MFr Bld: 5.3 % of total Hgb (ref <5.7)
Mean Plasma Glucose: 105 (calc)
eAG (mmol/L): 5.8 (calc)

## 2019-11-23 LAB — TSH: TSH: 1.12 mIU/L

## 2019-11-23 LAB — VITAMIN D 25 HYDROXY (VIT D DEFICIENCY, FRACTURES): Vit D, 25-Hydroxy: 33 ng/mL (ref 30–100)

## 2019-11-25 ENCOUNTER — Encounter: Payer: BLUE CROSS/BLUE SHIELD | Admitting: Obstetrics & Gynecology

## 2019-12-21 ENCOUNTER — Ambulatory Visit: Payer: BLUE CROSS/BLUE SHIELD

## 2019-12-22 ENCOUNTER — Ambulatory Visit
Admission: RE | Admit: 2019-12-22 | Discharge: 2019-12-22 | Disposition: A | Discharge status: Home / Self Care | Payer: BC Managed Care – PPO | Source: Ambulatory Visit | Attending: Obstetrics & Gynecology | Admitting: Obstetrics & Gynecology

## 2019-12-22 ENCOUNTER — Other Ambulatory Visit: Payer: Self-pay

## 2019-12-22 DIAGNOSIS — Z1231 Encounter for screening mammogram for malignant neoplasm of breast: Secondary | ICD-10-CM | POA: Diagnosis not present

## 2019-12-27 DIAGNOSIS — L720 Epidermal cyst: Secondary | ICD-10-CM | POA: Diagnosis not present

## 2019-12-29 DIAGNOSIS — L720 Epidermal cyst: Secondary | ICD-10-CM | POA: Diagnosis not present

## 2020-01-15 DIAGNOSIS — Z23 Encounter for immunization: Secondary | ICD-10-CM | POA: Diagnosis not present

## 2020-05-26 DIAGNOSIS — H10503 Unspecified blepharoconjunctivitis, bilateral: Secondary | ICD-10-CM | POA: Diagnosis not present

## 2020-05-26 DIAGNOSIS — T1512XA Foreign body in conjunctival sac, left eye, initial encounter: Secondary | ICD-10-CM | POA: Diagnosis not present

## 2020-07-10 DIAGNOSIS — T63421A Toxic effect of venom of ants, accidental (unintentional), initial encounter: Secondary | ICD-10-CM | POA: Diagnosis not present

## 2020-08-23 DIAGNOSIS — S62615A Displaced fracture of proximal phalanx of left ring finger, initial encounter for closed fracture: Secondary | ICD-10-CM | POA: Insufficient documentation

## 2020-08-23 DIAGNOSIS — S62617A Displaced fracture of proximal phalanx of left little finger, initial encounter for closed fracture: Secondary | ICD-10-CM | POA: Insufficient documentation

## 2020-08-23 DIAGNOSIS — S62615D Displaced fracture of proximal phalanx of left ring finger, subsequent encounter for fracture with routine healing: Secondary | ICD-10-CM | POA: Diagnosis not present

## 2020-08-28 ENCOUNTER — Ambulatory Visit (INDEPENDENT_AMBULATORY_CARE_PROVIDER_SITE_OTHER): Payer: Self-pay | Admitting: Podiatry

## 2020-08-28 ENCOUNTER — Other Ambulatory Visit: Payer: Self-pay

## 2020-08-28 ENCOUNTER — Encounter: Payer: Self-pay | Admitting: Podiatry

## 2020-08-28 ENCOUNTER — Ambulatory Visit (INDEPENDENT_AMBULATORY_CARE_PROVIDER_SITE_OTHER): Payer: BC Managed Care – PPO

## 2020-08-28 DIAGNOSIS — R600 Localized edema: Secondary | ICD-10-CM

## 2020-08-28 DIAGNOSIS — M779 Enthesopathy, unspecified: Secondary | ICD-10-CM

## 2020-08-28 DIAGNOSIS — M79671 Pain in right foot: Secondary | ICD-10-CM

## 2020-08-29 DIAGNOSIS — Y92481 Parking lot as the place of occurrence of the external cause: Secondary | ICD-10-CM | POA: Diagnosis not present

## 2020-08-29 DIAGNOSIS — M79642 Pain in left hand: Secondary | ICD-10-CM | POA: Diagnosis not present

## 2020-08-29 DIAGNOSIS — W010XXA Fall on same level from slipping, tripping and stumbling without subsequent striking against object, initial encounter: Secondary | ICD-10-CM | POA: Diagnosis not present

## 2020-08-29 DIAGNOSIS — S62617A Displaced fracture of proximal phalanx of left little finger, initial encounter for closed fracture: Secondary | ICD-10-CM | POA: Diagnosis not present

## 2020-08-29 DIAGNOSIS — S62615A Displaced fracture of proximal phalanx of left ring finger, initial encounter for closed fracture: Secondary | ICD-10-CM | POA: Diagnosis not present

## 2020-08-29 DIAGNOSIS — S62112A Displaced fracture of triquetrum [cuneiform] bone, left wrist, initial encounter for closed fracture: Secondary | ICD-10-CM | POA: Diagnosis not present

## 2020-08-29 DIAGNOSIS — M25532 Pain in left wrist: Secondary | ICD-10-CM | POA: Diagnosis not present

## 2020-08-29 DIAGNOSIS — I493 Ventricular premature depolarization: Secondary | ICD-10-CM | POA: Diagnosis not present

## 2020-08-29 DIAGNOSIS — S92354A Nondisplaced fracture of fifth metatarsal bone, right foot, initial encounter for closed fracture: Secondary | ICD-10-CM | POA: Diagnosis not present

## 2020-08-29 DIAGNOSIS — Z87891 Personal history of nicotine dependence: Secondary | ICD-10-CM | POA: Diagnosis not present

## 2020-08-30 NOTE — Progress Notes (Signed)
Subjective:   Patient ID: Jessica [name], female   DOB: 54 y.o.   MRN: [id number]   HPI patient states 8 days ago she had a tripping incident and fractured her right foot.  States that she is in a boot but she is concerned about healing and wants to have this checked   Review of Systems  All other systems reviewed and are negative.       Objective:  Physical Exam Vitals and nursing note reviewed.  Constitutional:      Appearance: She is well-developed.  Pulmonary:     Effort: Pulmonary effort is normal.  Musculoskeletal:        General: Normal range of motion.  Skin:    General: Skin is warm.  Neurological:     Mental Status: She is alert.     Neurovascular status intact muscle strength was found to be adequate with patient found to have swelling on the outside of the right foot around the fifth metatarsal base that is painful when pressed with bruising also noted.  Good digital perfusion well oriented      Assessment:  Probability for fracture base of fifth metatarsal right     Plan:  H&P x-ray reviewed and recommended that she continue boot usage but that this should heal uneventfully and do not recommend fixation currently.  If symptoms get worse patient will be seen back  X-rays indicate there is an avulsion fracture base of fifth metatarsal right foot

## 2020-09-01 ENCOUNTER — Telehealth: Payer: Self-pay | Admitting: Podiatry

## 2020-09-01 NOTE — Telephone Encounter (Signed)
Patient called in requesting handicap plaque, please advise

## 2020-09-05 ENCOUNTER — Other Ambulatory Visit: Payer: Self-pay | Admitting: Podiatry

## 2020-09-05 DIAGNOSIS — M779 Enthesopathy, unspecified: Secondary | ICD-10-CM

## 2020-09-05 DIAGNOSIS — S92351A Displaced fracture of fifth metatarsal bone, right foot, initial encounter for closed fracture: Secondary | ICD-10-CM | POA: Insufficient documentation

## 2020-10-03 ENCOUNTER — Telehealth: Payer: Self-pay | Admitting: Podiatry

## 2020-10-03 NOTE — Telephone Encounter (Signed)
Pt left message stating her doctor(orthot) prescribed a right turf toe insert with a lateral heel wedge and wanted to see if we carried that.  Jessica [name] called and left message for pt to call to discuss further.

## 2020-10-09 DIAGNOSIS — Z20822 Contact with and (suspected) exposure to covid-19: Secondary | ICD-10-CM | POA: Diagnosis not present

## 2020-10-10 DIAGNOSIS — Z20822 Contact with and (suspected) exposure to covid-19: Secondary | ICD-10-CM | POA: Diagnosis not present

## 2020-11-03 DIAGNOSIS — M9902 Segmental and somatic dysfunction of thoracic region: Secondary | ICD-10-CM | POA: Diagnosis not present

## 2020-11-03 DIAGNOSIS — M47817 Spondylosis without myelopathy or radiculopathy, lumbosacral region: Secondary | ICD-10-CM | POA: Diagnosis not present

## 2020-11-03 DIAGNOSIS — M47812 Spondylosis without myelopathy or radiculopathy, cervical region: Secondary | ICD-10-CM | POA: Diagnosis not present

## 2020-11-03 DIAGNOSIS — M9901 Segmental and somatic dysfunction of cervical region: Secondary | ICD-10-CM | POA: Diagnosis not present

## 2020-11-17 DIAGNOSIS — Z20822 Contact with and (suspected) exposure to covid-19: Secondary | ICD-10-CM | POA: Diagnosis not present

## 2020-11-23 ENCOUNTER — Ambulatory Visit (INDEPENDENT_AMBULATORY_CARE_PROVIDER_SITE_OTHER): Payer: BC Managed Care – PPO | Admitting: Obstetrics & Gynecology

## 2020-11-23 ENCOUNTER — Other Ambulatory Visit: Payer: Self-pay

## 2020-11-23 ENCOUNTER — Encounter: Payer: Self-pay | Admitting: Obstetrics & Gynecology

## 2020-11-23 VITALS — BP 124/80 | Ht 64.0 in | Wt 157.0 lb

## 2020-11-23 DIAGNOSIS — M85852 Other specified disorders of bone density and structure, left thigh: Secondary | ICD-10-CM

## 2020-11-23 DIAGNOSIS — Z01419 Encounter for gynecological examination (general) (routine) without abnormal findings: Secondary | ICD-10-CM

## 2020-11-23 DIAGNOSIS — Z780 Asymptomatic menopausal state: Secondary | ICD-10-CM

## 2020-11-23 DIAGNOSIS — Z1329 Encounter for screening for other suspected endocrine disorder: Secondary | ICD-10-CM | POA: Diagnosis not present

## 2020-11-23 DIAGNOSIS — M8588 Other specified disorders of bone density and structure, other site: Secondary | ICD-10-CM | POA: Diagnosis not present

## 2020-11-23 DIAGNOSIS — Z13220 Encounter for screening for lipoid disorders: Secondary | ICD-10-CM | POA: Diagnosis not present

## 2020-11-23 NOTE — Progress Notes (Signed)
Jessica [name] 01/25/1966 [id number]   History:    55 y.o. G2P1A1L1 Single. 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  QM:[id number] presenting for annual gyn exam   WNU:[id number], well on no HRT. No PMB. No pelvic pain. Abstinent since last Annual/Gyn exam.  Pap Neg/HPV HR neg 10/2017.Breasts normal.  Mammo neg 12/2019. BMI 26.95.  Doing Yoga.  Colono 2019.  BD 12/2017 mild Osteopenia Lt Femur Neck.  Recent fall with fracture of the Rt ankle and Lt hand, had surgery.  Fasting Health Labs here today.   Past medical history,surgical history, family history and social history were all reviewed and documented in the EPIC chart.  Gynecologic History Patient's last menstrual period was 10/15/2014.  Obstetric History OB History  Gravida Para Term Preterm AB Living  $Remov'3 2 1   1 1  'bjjaBK$ SAB IAB Ectopic Multiple Live Births  1            # Outcome Date GA Lbr Len/2nd Weight Sex Delivery Anes PTL Lv  3 Term           2 SAB           1 Para     M Vag-Spont        ROS: A ROS was performed and pertinent positives and negatives are included in the history.  GENERAL: No fevers or chills. HEENT: No change in vision, no earache, sore throat or sinus congestion. NECK: No pain or stiffness. CARDIOVASCULAR: No chest pain or pressure. No palpitations. PULMONARY: No shortness of breath, cough or wheeze. GASTROINTESTINAL: No abdominal pain, nausea, vomiting or diarrhea, melena or bright red blood per rectum. GENITOURINARY: No urinary frequency, urgency, hesitancy or dysuria. MUSCULOSKELETAL: No joint or muscle pain, no back pain, no recent trauma. DERMATOLOGIC: No rash, no itching, no lesions. ENDOCRINE: No polyuria, polydipsia, no heat or cold intolerance. No recent change in weight. HEMATOLOGICAL: No anemia or easy bruising or bleeding. NEUROLOGIC: No headache, seizures, numbness, tingling or weakness. PSYCHIATRIC: No depression, no loss of interest in normal activity or change in sleep  pattern.     Exam:   BP 124/80 (BP Location: Right Arm, Patient Position: Sitting, Cuff Size: Normal)   Ht $R'5\' 4"'pv$  (1.626 m)   Wt 157 lb (71.2 kg)   LMP 10/15/2014   BMI 26.95 kg/m   Body mass index is 26.95 kg/m.  General appearance : Well developed well nourished female. No acute distress HEENT: Eyes: no retinal hemorrhage or exudates,  Neck supple, trachea midline, no carotid bruits, no thyroidmegaly Lungs: Clear to auscultation, no rhonchi or wheezes, or rib retractions  Heart: Regular rate and rhythm, no murmurs or gallops Breast:Examined in sitting and supine position were symmetrical in appearance, no palpable masses or tenderness,  no skin retraction, no nipple inversion, no nipple discharge, no skin discoloration, no axillary or supraclavicular lymphadenopathy Abdomen: no palpable masses or tenderness, no rebound or guarding Extremities: no edema or skin discoloration or tenderness  Pelvic: Vulva: Normal             Vagina: No gross lesions or discharge.  Small vaginal wall cyst stable on Rt mid vagina  Cervix: No gross lesions or discharge  Uterus  AV, normal size, shape and consistency, non-tender and mobile  Adnexa  Without masses or tenderness  Anus: Normal   Assessment/Plan:  55 y.o. female for annual exam   1. Encounter for routine gynecological examination with Papanicolaou smear of cervix Normal gynecologic exam in menopause.  Small vaginal  wall cyst stable on the right mid vagina.  No indication for Pap test at this time.  Breast exam normal.  Screening mammogram negative March 2021.  Body mass index 26.95.  Continue with fitness and healthy nutrition after healing from the right ankle and left hand fractures.  Colonoscopy July 2019.  Fasting health labs here today. - CBC - Comp Met (CMET) - Lipid Profile - TSH - Vitamin D 1,25 dihydroxy - Pap IG w/ reflex to HPV when ASC-U  2. Postmenopausal Well on no hormone replacement therapy.  No postmenopausal  bleeding.  3. Osteopenia of neck of left femur Repeat bone density now.  Vitamin D supplements, calcium intake of 1.5 g/day and regular weightbearing physical activities. - DG Bone Density; Future  [name] MD, 9:07 AM 11/23/2020

## 2020-11-26 DIAGNOSIS — J0100 Acute maxillary sinusitis, unspecified: Secondary | ICD-10-CM | POA: Diagnosis not present

## 2020-11-27 LAB — PAP IG W/ RFLX HPV ASCU

## 2020-11-28 LAB — COMPREHENSIVE METABOLIC PANEL
AG Ratio: 1.8 (calc) (ref 1.0–2.5)
ALT: 18 U/L (ref 6–29)
AST: 24 U/L (ref 10–35)
Albumin: 4.4 g/dL (ref 3.6–5.1)
Alkaline phosphatase (APISO): 47 U/L (ref 37–153)
BUN: 10 mg/dL (ref 7–25)
CO2: 27 mmol/L (ref 20–32)
Calcium: 9.4 mg/dL (ref 8.6–10.4)
Chloride: 103 mmol/L (ref 98–110)
Creat: 0.6 mg/dL (ref 0.50–1.05)
Globulin: 2.4 g/dL (calc) (ref 1.9–3.7)
Glucose, Bld: 95 mg/dL (ref 65–99)
Potassium: 4.5 mmol/L (ref 3.5–5.3)
Sodium: 140 mmol/L (ref 135–146)
Total Bilirubin: 0.4 mg/dL (ref 0.2–1.2)
Total Protein: 6.8 g/dL (ref 6.1–8.1)

## 2020-11-28 LAB — VITAMIN D 1,25 DIHYDROXY
Vitamin D 1, 25 (OH)2 Total: 49 pg/mL (ref 18–72)
Vitamin D2 1, 25 (OH)2: <8 pg/mL
Vitamin D3 1, 25 (OH)2: 49 pg/mL

## 2020-11-28 LAB — CBC
HCT: 40.1 % (ref 35.0–45.0)
Hemoglobin: 13.8 g/dL (ref 11.7–15.5)
MCH: 31 pg (ref 27.0–33.0)
MCHC: 34.4 g/dL (ref 32.0–36.0)
MCV: 90.1 fL (ref 80.0–100.0)
MPV: 10.4 fL (ref 7.5–12.5)
Platelets: 191 10*3/uL (ref 140–400)
RBC: 4.45 10*6/uL (ref 3.80–5.10)
RDW: 12.1 % (ref 11.0–15.0)
WBC: 5.6 10*3/uL (ref 3.8–10.8)

## 2020-11-28 LAB — LIPID PANEL
Cholesterol: 194 mg/dL (ref <200)
HDL: 101 mg/dL (ref >=50)
LDL Cholesterol (Calc): 81 mg/dL (calc)
Non-HDL Cholesterol (Calc): 93 mg/dL (calc) (ref <130)
Total CHOL/HDL Ratio: 1.9 (calc) (ref <5.0)
Triglycerides: 43 mg/dL (ref <150)

## 2020-11-28 LAB — TSH: TSH: 1.04 mIU/L

## 2020-12-03 ENCOUNTER — Encounter: Payer: Self-pay | Admitting: Obstetrics & Gynecology

## 2021-01-16 ENCOUNTER — Ambulatory Visit (INDEPENDENT_AMBULATORY_CARE_PROVIDER_SITE_OTHER): Payer: BC Managed Care – PPO

## 2021-01-16 ENCOUNTER — Other Ambulatory Visit: Payer: Self-pay

## 2021-01-16 ENCOUNTER — Other Ambulatory Visit: Payer: Self-pay | Admitting: Obstetrics & Gynecology

## 2021-01-16 DIAGNOSIS — M9901 Segmental and somatic dysfunction of cervical region: Secondary | ICD-10-CM | POA: Diagnosis not present

## 2021-01-16 DIAGNOSIS — Z780 Asymptomatic menopausal state: Secondary | ICD-10-CM

## 2021-01-16 DIAGNOSIS — M47812 Spondylosis without myelopathy or radiculopathy, cervical region: Secondary | ICD-10-CM | POA: Diagnosis not present

## 2021-01-16 DIAGNOSIS — M9902 Segmental and somatic dysfunction of thoracic region: Secondary | ICD-10-CM | POA: Diagnosis not present

## 2021-01-16 DIAGNOSIS — M85852 Other specified disorders of bone density and structure, left thigh: Secondary | ICD-10-CM

## 2021-01-16 DIAGNOSIS — M47817 Spondylosis without myelopathy or radiculopathy, lumbosacral region: Secondary | ICD-10-CM | POA: Diagnosis not present

## 2021-01-19 DIAGNOSIS — M47817 Spondylosis without myelopathy or radiculopathy, lumbosacral region: Secondary | ICD-10-CM | POA: Diagnosis not present

## 2021-01-19 DIAGNOSIS — M9901 Segmental and somatic dysfunction of cervical region: Secondary | ICD-10-CM | POA: Diagnosis not present

## 2021-01-19 DIAGNOSIS — M9902 Segmental and somatic dysfunction of thoracic region: Secondary | ICD-10-CM | POA: Diagnosis not present

## 2021-01-19 DIAGNOSIS — M47812 Spondylosis without myelopathy or radiculopathy, cervical region: Secondary | ICD-10-CM | POA: Diagnosis not present

## 2021-01-25 DIAGNOSIS — L720 Epidermal cyst: Secondary | ICD-10-CM | POA: Diagnosis not present

## 2021-01-25 DIAGNOSIS — D225 Melanocytic nevi of trunk: Secondary | ICD-10-CM | POA: Diagnosis not present

## 2021-01-25 DIAGNOSIS — L821 Other seborrheic keratosis: Secondary | ICD-10-CM | POA: Diagnosis not present

## 2021-01-25 DIAGNOSIS — L578 Other skin changes due to chronic exposure to nonionizing radiation: Secondary | ICD-10-CM | POA: Diagnosis not present

## 2021-01-25 DIAGNOSIS — L814 Other melanin hyperpigmentation: Secondary | ICD-10-CM | POA: Diagnosis not present

## 2021-01-31 DIAGNOSIS — L02413 Cutaneous abscess of right upper limb: Secondary | ICD-10-CM | POA: Diagnosis not present

## 2021-01-31 DIAGNOSIS — R531 Weakness: Secondary | ICD-10-CM | POA: Diagnosis not present

## 2021-02-16 DIAGNOSIS — M9902 Segmental and somatic dysfunction of thoracic region: Secondary | ICD-10-CM | POA: Diagnosis not present

## 2021-02-16 DIAGNOSIS — M9901 Segmental and somatic dysfunction of cervical region: Secondary | ICD-10-CM | POA: Diagnosis not present

## 2021-02-16 DIAGNOSIS — M47812 Spondylosis without myelopathy or radiculopathy, cervical region: Secondary | ICD-10-CM | POA: Diagnosis not present

## 2021-02-16 DIAGNOSIS — M47817 Spondylosis without myelopathy or radiculopathy, lumbosacral region: Secondary | ICD-10-CM | POA: Diagnosis not present

## 2021-04-11 DIAGNOSIS — M47817 Spondylosis without myelopathy or radiculopathy, lumbosacral region: Secondary | ICD-10-CM | POA: Diagnosis not present

## 2021-04-11 DIAGNOSIS — M9901 Segmental and somatic dysfunction of cervical region: Secondary | ICD-10-CM | POA: Diagnosis not present

## 2021-04-11 DIAGNOSIS — M9902 Segmental and somatic dysfunction of thoracic region: Secondary | ICD-10-CM | POA: Diagnosis not present

## 2021-04-11 DIAGNOSIS — M47812 Spondylosis without myelopathy or radiculopathy, cervical region: Secondary | ICD-10-CM | POA: Diagnosis not present

## 2021-04-13 ENCOUNTER — Other Ambulatory Visit: Payer: Self-pay | Admitting: *Deleted

## 2021-04-13 MED ORDER — VALACYCLOVIR HCL 1 G PO TABS: 1000.0000 mg | ORAL_TABLET | Freq: Two times a day (BID) | ORAL | 2 refills | Status: DC

## 2021-04-13 NOTE — Telephone Encounter (Signed)
Pt requesting refill on Valtrex. Will send refill request to Provider for Approval.

## 2021-04-13 NOTE — Telephone Encounter (Signed)
Sent to different pharmacy. Called and canceled the rx at [facility]

## 2021-04-13 NOTE — Addendum Note (Signed)
Addended by: Lorine Bears on: 04/13/2021 02:57 PM   Modules accepted: Orders

## 2021-05-21 DIAGNOSIS — M9901 Segmental and somatic dysfunction of cervical region: Secondary | ICD-10-CM | POA: Diagnosis not present

## 2021-05-21 DIAGNOSIS — M47817 Spondylosis without myelopathy or radiculopathy, lumbosacral region: Secondary | ICD-10-CM | POA: Diagnosis not present

## 2021-05-21 DIAGNOSIS — M9902 Segmental and somatic dysfunction of thoracic region: Secondary | ICD-10-CM | POA: Diagnosis not present

## 2021-05-21 DIAGNOSIS — M47812 Spondylosis without myelopathy or radiculopathy, cervical region: Secondary | ICD-10-CM | POA: Diagnosis not present

## 2021-07-10 DIAGNOSIS — M9902 Segmental and somatic dysfunction of thoracic region: Secondary | ICD-10-CM | POA: Diagnosis not present

## 2021-07-10 DIAGNOSIS — M4726 Other spondylosis with radiculopathy, lumbar region: Secondary | ICD-10-CM | POA: Diagnosis not present

## 2021-07-10 DIAGNOSIS — M4724 Other spondylosis with radiculopathy, thoracic region: Secondary | ICD-10-CM | POA: Diagnosis not present

## 2021-07-10 DIAGNOSIS — M4728 Other spondylosis with radiculopathy, sacral and sacrococcygeal region: Secondary | ICD-10-CM | POA: Diagnosis not present

## 2021-07-11 DIAGNOSIS — M9901 Segmental and somatic dysfunction of cervical region: Secondary | ICD-10-CM | POA: Diagnosis not present

## 2021-07-11 DIAGNOSIS — M47812 Spondylosis without myelopathy or radiculopathy, cervical region: Secondary | ICD-10-CM | POA: Diagnosis not present

## 2021-07-11 DIAGNOSIS — M47817 Spondylosis without myelopathy or radiculopathy, lumbosacral region: Secondary | ICD-10-CM | POA: Diagnosis not present

## 2021-07-11 DIAGNOSIS — M9902 Segmental and somatic dysfunction of thoracic region: Secondary | ICD-10-CM | POA: Diagnosis not present

## 2021-07-16 DIAGNOSIS — M47817 Spondylosis without myelopathy or radiculopathy, lumbosacral region: Secondary | ICD-10-CM | POA: Diagnosis not present

## 2021-07-16 DIAGNOSIS — M9901 Segmental and somatic dysfunction of cervical region: Secondary | ICD-10-CM | POA: Diagnosis not present

## 2021-07-16 DIAGNOSIS — M9902 Segmental and somatic dysfunction of thoracic region: Secondary | ICD-10-CM | POA: Diagnosis not present

## 2021-07-16 DIAGNOSIS — M47812 Spondylosis without myelopathy or radiculopathy, cervical region: Secondary | ICD-10-CM | POA: Diagnosis not present

## 2021-07-18 DIAGNOSIS — J00 Acute nasopharyngitis [common cold]: Secondary | ICD-10-CM | POA: Diagnosis not present

## 2021-08-28 DIAGNOSIS — M47812 Spondylosis without myelopathy or radiculopathy, cervical region: Secondary | ICD-10-CM | POA: Diagnosis not present

## 2021-08-28 DIAGNOSIS — M47817 Spondylosis without myelopathy or radiculopathy, lumbosacral region: Secondary | ICD-10-CM | POA: Diagnosis not present

## 2021-08-28 DIAGNOSIS — M9901 Segmental and somatic dysfunction of cervical region: Secondary | ICD-10-CM | POA: Diagnosis not present

## 2021-08-28 DIAGNOSIS — M9902 Segmental and somatic dysfunction of thoracic region: Secondary | ICD-10-CM | POA: Diagnosis not present

## 2021-09-12 DIAGNOSIS — M47812 Spondylosis without myelopathy or radiculopathy, cervical region: Secondary | ICD-10-CM | POA: Diagnosis not present

## 2021-09-12 DIAGNOSIS — M9902 Segmental and somatic dysfunction of thoracic region: Secondary | ICD-10-CM | POA: Diagnosis not present

## 2021-09-12 DIAGNOSIS — M9901 Segmental and somatic dysfunction of cervical region: Secondary | ICD-10-CM | POA: Diagnosis not present

## 2021-09-12 DIAGNOSIS — M47817 Spondylosis without myelopathy or radiculopathy, lumbosacral region: Secondary | ICD-10-CM | POA: Diagnosis not present

## 2021-10-24 DIAGNOSIS — M9902 Segmental and somatic dysfunction of thoracic region: Secondary | ICD-10-CM | POA: Diagnosis not present

## 2021-10-24 DIAGNOSIS — M47812 Spondylosis without myelopathy or radiculopathy, cervical region: Secondary | ICD-10-CM | POA: Diagnosis not present

## 2021-10-24 DIAGNOSIS — M47817 Spondylosis without myelopathy or radiculopathy, lumbosacral region: Secondary | ICD-10-CM | POA: Diagnosis not present

## 2021-10-24 DIAGNOSIS — M9901 Segmental and somatic dysfunction of cervical region: Secondary | ICD-10-CM | POA: Diagnosis not present

## 2021-11-28 ENCOUNTER — Ambulatory Visit: Payer: BC Managed Care – PPO | Admitting: Obstetrics & Gynecology

## 2021-12-05 DIAGNOSIS — L728 Other follicular cysts of the skin and subcutaneous tissue: Secondary | ICD-10-CM | POA: Diagnosis not present

## 2021-12-05 DIAGNOSIS — D2239 Melanocytic nevi of other parts of face: Secondary | ICD-10-CM | POA: Diagnosis not present

## 2021-12-05 DIAGNOSIS — L570 Actinic keratosis: Secondary | ICD-10-CM | POA: Diagnosis not present

## 2021-12-11 ENCOUNTER — Other Ambulatory Visit: Payer: Self-pay | Admitting: Obstetrics & Gynecology

## 2021-12-11 ENCOUNTER — Encounter: Payer: Self-pay | Admitting: Obstetrics & Gynecology

## 2021-12-11 ENCOUNTER — Ambulatory Visit (INDEPENDENT_AMBULATORY_CARE_PROVIDER_SITE_OTHER): Payer: BC Managed Care – PPO | Admitting: Obstetrics & Gynecology

## 2021-12-11 ENCOUNTER — Other Ambulatory Visit: Payer: Self-pay

## 2021-12-11 VITALS — BP 114/64 | HR 70 | Resp 16 | Ht 66.25 in | Wt 139.0 lb

## 2021-12-11 DIAGNOSIS — M85852 Other specified disorders of bone density and structure, left thigh: Secondary | ICD-10-CM | POA: Diagnosis not present

## 2021-12-11 DIAGNOSIS — Z01419 Encounter for gynecological examination (general) (routine) without abnormal findings: Secondary | ICD-10-CM | POA: Diagnosis not present

## 2021-12-11 DIAGNOSIS — B009 Herpesviral infection, unspecified: Secondary | ICD-10-CM

## 2021-12-11 DIAGNOSIS — Z780 Asymptomatic menopausal state: Secondary | ICD-10-CM | POA: Diagnosis not present

## 2021-12-11 DIAGNOSIS — E042 Nontoxic multinodular goiter: Secondary | ICD-10-CM | POA: Diagnosis not present

## 2021-12-11 DIAGNOSIS — Z8639 Personal history of other endocrine, nutritional and metabolic disease: Secondary | ICD-10-CM | POA: Diagnosis not present

## 2021-12-11 DIAGNOSIS — Z1231 Encounter for screening mammogram for malignant neoplasm of breast: Secondary | ICD-10-CM

## 2021-12-11 MED ORDER — VALACYCLOVIR HCL 1 G PO TABS: 1000.0000 mg | ORAL_TABLET | Freq: Two times a day (BID) | ORAL | 3 refills | Status: AC

## 2021-12-11 NOTE — Progress Notes (Addendum)
VIOLIA KNOPF 15-Sep-1966 [id number]   History:    56 y.o. G2P1A1L1 Single.  Son 13 yo Health and safety inspector in Nurse, children's.   RP:  Established patient presenting for annual gyn exam    HPI: Postmenopause, well on no HRT.  No PMB.  No pelvic pain.  Abstinent.  H/O Genital HSV, occasional recurrences. Pap Neg/HPV HR neg in 11/2020. Breasts normal.  Mammo neg 12/2019, will schedule mammo now.  BMI 22.27.  Doing Yoga. Fasting Health Labs here today.  BMD Osteopenia T-Score -1.3 stable on 01-16-21.  COLONOSCOPY: 04-29-18  Past medical history,surgical history, family history and social history were all reviewed and documented in the EPIC chart.  Gynecologic History Patient's last menstrual period was 10/15/2014.  Obstetric History OB History  Gravida Para Term Preterm AB Living  2 1 0   1 1  SAB IAB Ectopic Multiple Live Births  1            # Outcome Date GA Lbr Len/2nd Weight Sex Delivery Anes PTL Lv  2 SAB           1 Para     M Vag-Spont        ROS: A ROS was performed and pertinent positives and negatives are included in the history.  GENERAL: No fevers or chills. HEENT: No change in vision, no earache, sore throat or sinus congestion. NECK: No pain or stiffness. CARDIOVASCULAR: No chest pain or pressure. No palpitations. PULMONARY: No shortness of breath, cough or wheeze. GASTROINTESTINAL: No abdominal pain, nausea, vomiting or diarrhea, melena or bright red blood per rectum. GENITOURINARY: No urinary frequency, urgency, hesitancy or dysuria. MUSCULOSKELETAL: No joint or muscle pain, no back pain, no recent trauma. DERMATOLOGIC: No rash, no itching, no lesions. ENDOCRINE: No polyuria, polydipsia, no heat or cold intolerance. No recent change in weight. HEMATOLOGICAL: No anemia or easy bruising or bleeding. NEUROLOGIC: No headache, seizures, numbness, tingling or weakness. PSYCHIATRIC: No depression, no loss of interest in normal activity or change in sleep pattern.     Exam:   BP 114/64     Pulse 70    Resp 16    Ht 5' 6.25" (1.683 m)    Wt 139 lb (63 kg)    LMP 10/15/2014    BMI 22.27 kg/m   Body mass index is 22.27 kg/m.  General appearance : Well developed well nourished female. No acute distress HEENT: Eyes: no retinal hemorrhage or exudates,  Neck supple, trachea midline, no carotid bruits, no thyroidmegaly Lungs: Clear to auscultation, no rhonchi or wheezes, or rib retractions  Heart: Regular rate and rhythm, no murmurs or gallops Breast:Examined in sitting and supine position were symmetrical in appearance, no palpable masses or tenderness,  no skin retraction, no nipple inversion, no nipple discharge, no skin discoloration, no axillary or supraclavicular lymphadenopathy Abdomen: no palpable masses or tenderness, no rebound or guarding Extremities: no edema or skin discoloration or tenderness  Pelvic: Vulva: Normal             Vagina: No gross lesions or discharge  Cervix: No gross lesions or discharge  Uterus  AV, normal size, shape and consistency, non-tender and mobile  Adnexa  Without masses or tenderness  Anus: Normal   Assessment/Plan:  56 y.o. female for annual exam   1. Well female exam with routine gynecological exam Postmenopause, well on no HRT.  No PMB.  No pelvic pain.  Abstinent.  H/O Genital HSV, occasional recurrences. Pap Neg/HPV HR neg in 11/2020. Breasts  normal.  Mammo neg 12/2019, will schedule mammo now.  BMI 22.27.  Doing Yoga. Fasting Health Labs here today.  BMD Osteopenia T-Score -1.3 stable on 01-16-21.  COLONOSCOPY: 04-29-18 - CBC - Comp Met (CMET) - TSH - Lipid Profile - Vitamin D (25 hydroxy)  2. Postmenopausal Postmenopause, well on no HRT.  No PMB.  No pelvic pain.  Abstinent.   3. Osteopenia of neck of left femur  BMD Osteopenia T-Score -1.3 stable on 01-16-21.  Vit D supplement, Ca++ 1.5 g/d total and regular weight bearing physical activities.  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  Will treat recurrences with Valacyclovir.  Usage reviewed and prescription sent to pharmacy.  Other orders - valACYclovir (VALTREX) 1000 MG tablet; Take 1 tablet (1,000 mg total) by mouth 2 (two) times daily for 5 days.   [name] MD, 9:21 AM 12/11/2021

## 2021-12-11 NOTE — Addendum Note (Signed)
Addended by: [name] on: 12/11/2021 10:02 AM   Modules accepted: Orders

## 2021-12-12 DIAGNOSIS — M47817 Spondylosis without myelopathy or radiculopathy, lumbosacral region: Secondary | ICD-10-CM | POA: Diagnosis not present

## 2021-12-12 DIAGNOSIS — M9902 Segmental and somatic dysfunction of thoracic region: Secondary | ICD-10-CM | POA: Diagnosis not present

## 2021-12-12 DIAGNOSIS — M9901 Segmental and somatic dysfunction of cervical region: Secondary | ICD-10-CM | POA: Diagnosis not present

## 2021-12-12 DIAGNOSIS — M47812 Spondylosis without myelopathy or radiculopathy, cervical region: Secondary | ICD-10-CM | POA: Diagnosis not present

## 2021-12-12 LAB — TSH: TSH: 0.74 mIU/L

## 2021-12-12 LAB — COMPREHENSIVE METABOLIC PANEL
AG Ratio: 1.9 (calc) (ref 1.0–2.5)
ALT: 25 U/L (ref 6–29)
AST: 25 U/L (ref 10–35)
Albumin: 4.6 g/dL (ref 3.6–5.1)
Alkaline phosphatase (APISO): 44 U/L (ref 37–153)
BUN: 17 mg/dL (ref 7–25)
CO2: 28 mmol/L (ref 20–32)
Calcium: 9.7 mg/dL (ref 8.6–10.4)
Chloride: 102 mmol/L (ref 98–110)
Creat: 0.67 mg/dL (ref 0.50–1.03)
Globulin: 2.4 g/dL (calc) (ref 1.9–3.7)
Glucose, Bld: 109 mg/dL — ABNORMAL HIGH (ref 65–99)
Potassium: 5.8 mmol/L — ABNORMAL HIGH (ref 3.5–5.3)
Sodium: 138 mmol/L (ref 135–146)
Total Bilirubin: 0.4 mg/dL (ref 0.2–1.2)
Total Protein: 7 g/dL (ref 6.1–8.1)

## 2021-12-12 LAB — CBC
HCT: 41.9 % (ref 35.0–45.0)
Hemoglobin: 14 g/dL (ref 11.7–15.5)
MCH: 31.4 pg (ref 27.0–33.0)
MCHC: 33.4 g/dL (ref 32.0–36.0)
MCV: 93.9 fL (ref 80.0–100.0)
MPV: 10.4 fL (ref 7.5–12.5)
Platelets: 222 10*3/uL (ref 140–400)
RBC: 4.46 10*6/uL (ref 3.80–5.10)
RDW: 12 % (ref 11.0–15.0)
WBC: 5.9 10*3/uL (ref 3.8–10.8)

## 2021-12-12 LAB — LIPID PANEL
Cholesterol: 185 mg/dL (ref <200)
HDL: 93 mg/dL (ref >=50)
LDL Cholesterol (Calc): 82 mg/dL (calc)
Non-HDL Cholesterol (Calc): 92 mg/dL (calc) (ref <130)
Total CHOL/HDL Ratio: 2 (calc) (ref <5.0)
Triglycerides: 34 mg/dL (ref <150)

## 2021-12-12 LAB — VITAMIN D 25 HYDROXY (VIT D DEFICIENCY, FRACTURES): Vit D, 25-Hydroxy: 48 ng/mL (ref 30–100)

## 2021-12-24 ENCOUNTER — Encounter: Payer: Self-pay | Admitting: *Deleted

## 2021-12-24 ENCOUNTER — Other Ambulatory Visit: Payer: Self-pay | Admitting: *Deleted

## 2021-12-24 DIAGNOSIS — R7309 Other abnormal glucose: Secondary | ICD-10-CM

## 2021-12-24 DIAGNOSIS — E875 Hyperkalemia: Secondary | ICD-10-CM

## 2021-12-25 ENCOUNTER — Other Ambulatory Visit: Payer: Self-pay

## 2021-12-25 ENCOUNTER — Ambulatory Visit
Admission: RE | Admit: 2021-12-25 | Discharge: 2021-12-25 | Disposition: A | Discharge status: Home / Self Care | Payer: BC Managed Care – PPO | Source: Ambulatory Visit | Attending: Obstetrics & Gynecology | Admitting: Obstetrics & Gynecology

## 2021-12-25 DIAGNOSIS — Z1231 Encounter for screening mammogram for malignant neoplasm of breast: Secondary | ICD-10-CM | POA: Diagnosis not present

## 2021-12-28 ENCOUNTER — Encounter: Payer: Self-pay | Admitting: Obstetrics & Gynecology

## 2021-12-28 NOTE — Telephone Encounter (Signed)
I spoke with Dr.Lavoie to confirm what to do about A1C and she told me to tell patient to have this checked in 3 months. See my chart message conversation with patient on 12/28/21 regarding this.  ?

## 2022-01-02 ENCOUNTER — Other Ambulatory Visit: Payer: BC Managed Care – PPO

## 2022-01-04 ENCOUNTER — Other Ambulatory Visit: Payer: BC Managed Care – PPO

## 2022-01-07 ENCOUNTER — Other Ambulatory Visit: Payer: BC Managed Care – PPO

## 2022-01-07 ENCOUNTER — Other Ambulatory Visit: Payer: Self-pay

## 2022-01-07 DIAGNOSIS — R7309 Other abnormal glucose: Secondary | ICD-10-CM

## 2022-01-07 DIAGNOSIS — E875 Hyperkalemia: Secondary | ICD-10-CM

## 2022-01-08 LAB — HEMOGLOBIN A1C
Hgb A1c MFr Bld: 5.4 % of total Hgb (ref <5.7)
Mean Plasma Glucose: 108 mg/dL
eAG (mmol/L): 6 mmol/L

## 2022-01-08 LAB — POTASSIUM: Potassium: 4.2 mmol/L (ref 3.5–5.3)

## 2022-01-28 DIAGNOSIS — M9901 Segmental and somatic dysfunction of cervical region: Secondary | ICD-10-CM | POA: Diagnosis not present

## 2022-01-28 DIAGNOSIS — M47812 Spondylosis without myelopathy or radiculopathy, cervical region: Secondary | ICD-10-CM | POA: Diagnosis not present

## 2022-01-28 DIAGNOSIS — M9902 Segmental and somatic dysfunction of thoracic region: Secondary | ICD-10-CM | POA: Diagnosis not present

## 2022-01-28 DIAGNOSIS — M47817 Spondylosis without myelopathy or radiculopathy, lumbosacral region: Secondary | ICD-10-CM | POA: Diagnosis not present

## 2022-02-04 ENCOUNTER — Encounter: Payer: Self-pay | Admitting: Obstetrics & Gynecology

## 2022-02-04 DIAGNOSIS — R011 Cardiac murmur, unspecified: Secondary | ICD-10-CM

## 2022-02-21 ENCOUNTER — Ambulatory Visit (INDEPENDENT_AMBULATORY_CARE_PROVIDER_SITE_OTHER): Payer: BC Managed Care – PPO | Admitting: Cardiovascular Disease

## 2022-02-21 ENCOUNTER — Other Ambulatory Visit: Payer: Self-pay

## 2022-02-21 VITALS — BP 118/74 | HR 65 | Ht 66.5 in | Wt 145.0 lb

## 2022-02-21 DIAGNOSIS — D6852 Prothrombin gene mutation: Secondary | ICD-10-CM

## 2022-02-21 DIAGNOSIS — R011 Cardiac murmur, unspecified: Secondary | ICD-10-CM

## 2022-02-21 DIAGNOSIS — R002 Palpitations: Secondary | ICD-10-CM | POA: Diagnosis not present

## 2022-02-21 DIAGNOSIS — Z8249 Family history of ischemic heart disease and other diseases of the circulatory system: Secondary | ICD-10-CM

## 2022-02-21 DIAGNOSIS — Z9189 Other specified personal risk factors, not elsewhere classified: Secondary | ICD-10-CM

## 2022-02-21 MED ORDER — METOPROLOL TARTRATE 25 MG PO TABS: 25.0000 mg | ORAL_TABLET | ORAL | 3 refills | Status: DC | PRN

## 2022-02-21 MED ORDER — METOPROLOL TARTRATE 25 MG PO TABS: 25.0000 mg | ORAL_TABLET | Freq: Every day | ORAL | 3 refills | Status: AC | PRN

## 2022-02-21 NOTE — Progress Notes (Signed)
I ?Jessica [name] thank you for sending it was a special thank you ? ?Cardiology Office Note   ? ?Date:  03/02/2022  ? ?ID:  Jessica [name], DOB September 08, 1966, MRN [id number] ? ?PCP:  Jessica Bruins, MD  ?Cardiologist:  Jessica Majestic, MD  ? ?Chief Complaint  ?Patient presents with  ? New Patient (Initial Visit)  ? Headache  ? Tachycardia  ?    ? ? ?History of Present Illness:  ?Jessica [name] is a 56 y.o. female who I had remotely seen many years ago.  I do not have any records from my remote practice but apparently I had seen her for her PVCs and she was told of having a mild heart murmur.  She is followed by Dr. Waldemar [name] at the [facility].  Jessica [name] had contacted her with a complaint that she had experienced episodic palpitations with heart racing between 90 and 120 bpm while lying down with each episode lasting for over 2 hours.  As result, she requested that she be referred to me for further cardiology evaluation.  Patient states her father has a history of atrial fibrillation.  Two months ago, after eating pizza and going to bed her heart rate had increased over 100 and lasted at least 2 hours 2 months ago.  She has had a total of 3 episodes over the last several months.  She believes she sleeps well typically goes to bed at 10 pm and often sleeps throughout the night unless she is under significant increased stress and then may experience frequent awakenings.  She is heterozygous for prothrombin gene mutation.  She is not on any medications.  She exercises by doing yoga in the morning and walks her dogs.  She recently had laboratory in February and March 2023 which showed a vitamin D level at 48, stable CBC hemoglobin 14 hematocrit 41.9.  TSH 0.74.  Total cholesterol 185, HDL 93, triglycerides 34, and LDL 81 which was significantly improved from 2021 when her total cholesterol was 209 and LDL 103.  Laboratory on December 11, 2021 showed most likely factitious hyperkalemia at 5.8.   Subsequent laboratory shows normalization of potassium at 4.2 on January 08, 2019.  She denies any chest pain.  She is unaware of any snoring.  She denies daytime sleepiness.  An Epworth Sleepiness Scale score was calculated in the office today and this endorsed at 3.  She presents for initial re-evaluation. ? ? ?Past Medical History:  ?Diagnosis Date  ? Allergy   ? Anxiety   ? Broken finger   ? broke 2 fingers (left hand)  ? Broken foot   ? right  ? Clotting disorder ([place])   ? Endometriosis   ? HSV-2 (herpes simplex virus 2) infection   ? 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 PVC's (premature ventricular contractions)   ? ? ?Past Surgical History:  ?Procedure Laterality Date  ? BUNIONECTOMY    ? BOTH FEET  ? HAND SURGERY    ? HERNIA REPAIR    ? INGUINAL HERNIA REPAIR    ? LEFT  ? PELVIC LAPAROSCOPY    ? ENDOMETRIOSIS  ? TONSILLECTOMY    ? ? ?Current Medications: ?Outpatient Medications Prior to Visit  ?Medication Sig Dispense Refill  ? Multiple Vitamin (MULTIVITAMIN) tablet Take 1 tablet by mouth daily.    ? ?No facility-administered medications prior to visit.  ?  ? ?Allergies:   Oxycodone-acetaminophen, Propoxyphene, and Codeine  ? ?Social History  ? ?  Socioeconomic History  ? Marital status: Divorced  ?  Spouse name: Not on file  ? Number of children: Not on file  ? Years of education: Not on file  ? Highest education level: Not on file  ?Occupational History  ? Not on file  ?Tobacco Use  ? Smoking status: Former  ?  Types: Cigarettes  ?  Quit date: 01/18/2007  ?  Years since quitting: 15.1  ? Smokeless tobacco: Never  ?Vaping Use  ? Vaping Use: Never used  ?Substance and Sexual Activity  ? Alcohol use: Not Currently  ?  Alcohol/week: 5.0 standard drinks  ?  Types: 5 Glasses of wine per week  ? Drug use: No  ? Sexual activity: Not Currently  ?  Birth control/protection: Post-menopausal  ?  Comment: des neg  ?Other Topics Concern  ? Not on file  ?Social History Narrative  ? Not on file   ? ?Social Determinants of Health  ? ?Financial Resource Strain: Not on file  ?Food Insecurity: Not on file  ?Transportation Needs: Not on file  ?Physical Activity: Not on file  ?Stress: Not on file  ?Social Connections: Not on file  ?  ?Social history is notable and that she was born in [place].  She is divorced.  She has a son who is age 89.  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 There is remote tobacco history but she quit in 2008.  She drinks occasional wine.  She does yoga and walks her dogs. ? ?Family History:  The patient's family history includes Breast cancer in her maternal aunt and sister; Cancer in her maternal aunt, maternal grandmother, paternal grandfather, paternal uncle, and sister; Colon cancer in her maternal aunt, paternal grandfather, and paternal uncle; Colon polyps in her father and sister; Diabetes in her paternal grandmother.  ? ?Her mother is currently 7 and father 60.  Her father has persistent hypertension.  She has 2 sisters ages 75 and 46 and her 56 year old sister has a history of atrial fibrillation. ? ?ROS ?General: Negative; No fevers, chills, or night sweats;  ?HEENT: Negative; No changes in vision or hearing, sinus congestion, difficulty swallowing ?Pulmonary: Negative; No cough, wheezing, shortness of breath, hemoptysis ?Cardiovascular: See HPI ?GI: Negative; No nausea, vomiting, diarrhea, or abdominal pain ?GU: Negative; No dysuria, hematuria, or difficulty voiding ?Musculoskeletal: Negative; no myalgias, joint pain, or weakness ?Hematologic/Oncology: Negative; no easy bruising, bleeding ?Endocrine: Negative; no heat/cold intolerance; no diabetes ?Neuro: Negative; no changes in balance, headaches ?Skin: Negative; No rashes or skin lesions ?Psychiatric: Negative; No behavioral problems, depression ?Sleep: Negative; No snoring, daytime sleepiness, hypersomnolence, bruxism, restless legs, hypnogognic hallucinations, no cataplexy ?Other comprehensive 14  point system review is negative. ? ? ?PHYSICAL EXAM:   ?VS:  BP 118/74 (BP Location: Left Arm, Patient Position: Sitting, Cuff Size: Normal)   Pulse 65   Ht 5' 6.5" (1.689 m)   Wt 145 lb (65.8 kg)   LMP 10/15/2014   BMI 23.05 kg/m?    ? ?Repeat blood pressure by me was 122/74 supine and 124/74 standing ? ?Wt Readings from Last 3 Encounters:  ?02/21/22 145 lb (65.8 kg)  ?12/11/21 139 lb (63 kg)  ?11/23/20 157 lb (71.2 kg)  ?  ?General: Alert, oriented, no distress.  ?Skin: normal turgor, no rashes, warm and dry ?HEENT: Normocephalic, atraumatic. Pupils equal round and reactive to light; sclera anicteric; extraocular muscles intact;  ?Nose without nasal septal hypertrophy ?Mouth/Parynx benign; Mallinpatti scale ?Neck: No JVD, no carotid  bruits; normal carotid upstroke ?Lungs: clear to ausculatation and percussion; no wheezing or rales ?Chest wall: without tenderness to palpitation ?Heart: PMI not displaced, RRR, s1 s2 normal, 1/6 systolic murmur at the left sternal border and apex, no diastolic murmur, no rubs, gallops, thrills, or heaves ?Abdomen: soft, nontender; no hepatosplenomehaly, BS+; abdominal aorta nontender and not dilated by palpation. ?Back: no CVA tenderness ?Pulses 2+ ?Musculoskeletal: full range of motion, normal strength, no joint deformities ?Extremities: no clubbing cyanosis or edema, Homan's sign negative  ?Neurologic: grossly nonfocal; Cranial nerves grossly wnl ?Psychologic: Normal mood and affect ? ? ?Studies/Labs Reviewed:  ? ?Feb 21, 2022 ECG (independently read by me): NSR at 65, mild sinus arhythmia , no significant STT change ? ?Recent Labs: ? ?  Latest Ref Rng & Units 02/21/2022  ?  2:11 PM 01/07/2022  ?  4:10 PM 12/11/2021  ?  9:41 AM  ?BMP  ?Glucose 70 - 99 mg/dL 93    109    ?BUN 6 - 24 mg/dL 22    17    ?Creatinine 0.57 - 1.00 mg/dL 0.56    0.67    ?BUN/Creat Ratio 9 - 23 39    NOT APPLICABLE    ?Sodium 134 - 144 mmol/L 138    138    ?Potassium 3.5 - 5.2 mmol/L 4.6   4.2   5.8     ?Chloride 96 - 106 mmol/L 100    102    ?CO2 20 - 29 mmol/L 26    28    ?Calcium 8.7 - 10.2 mg/dL 9.3    9.7    ? ? ? ? ?  Latest Ref Rng & Units 12/11/2021  ?  9:41 AM 11/23/2020  ?  9:28 AM 11/22/2019  ?  9:08 AM  ?Hepatic Functio

## 2022-02-21 NOTE — Patient Instructions (Signed)
Medication Instructions:  ?Take Metoprolol Tartrate 25 mg as needed for palpitations.  ? ?*If you need a refill on your cardiac medications before your next appointment, please call your pharmacy* ? ? ?Lab Work: ?BMET, MAG today  ? ?If you have labs (blood work) drawn today and your tests are completely normal, you will receive your results only by: ?MyChart Message (if you have MyChart) OR ?A paper copy in the mail ?If you have any lab test that is abnormal or we need to change your treatment, we will call you to review the results. ? ? ?Testing/Procedures: ?Echocardiogram - Your physician has requested that you have an echocardiogram. Echocardiography is a painless test that uses sound waves to create images of your heart. It provides your doctor with information about the size and shape of your heart and how well your heart?s chambers and valves are working. This procedure takes approximately one hour. There are no restrictions for this procedure.  ? ? ? ?Follow-Up: ?At [facility], you and your health needs are our priority.  As part of our continuing mission to provide you with exceptional heart care, we have created designated Provider Care Teams.  These Care Teams include your primary Cardiologist (physician) and Advanced Practice Providers (APPs -  Physician Assistants and Nurse Practitioners) who all work together to provide you with the care you need, when you need it. ? ?We recommend signing up for the patient portal called "MyChart".  Sign up information is provided on this After Visit Summary.  MyChart is used to connect with patients for Virtual Visits (Telemedicine).  Patients are able to view lab/test results, encounter notes, upcoming appointments, etc.  Non-urgent messages can be sent to your provider as well.   ?To learn more about what you can do with MyChart, go to [email or web address].   ? ?Your next appointment:   ?4 month(s) ? ?The format for your next appointment:   ?In  Person ? ?Provider:   ?Shelva Majestic, MD  ? ? ? ? ? ? ? ? ? ? ? ?

## 2022-02-22 LAB — BASIC METABOLIC PANEL
BUN/Creatinine Ratio: 39 — ABNORMAL HIGH (ref 9–23)
BUN: 22 mg/dL (ref 6–24)
CO2: 26 mmol/L (ref 20–29)
Calcium: 9.3 mg/dL (ref 8.7–10.2)
Chloride: 100 mmol/L (ref 96–106)
Creatinine, Ser: 0.56 mg/dL — ABNORMAL LOW (ref 0.57–1.00)
Glucose: 93 mg/dL (ref 70–99)
Potassium: 4.6 mmol/L (ref 3.5–5.2)
Sodium: 138 mmol/L (ref 134–144)
eGFR: 107 mL/min/{1.73_m2} (ref >59)

## 2022-02-22 LAB — MAGNESIUM: Magnesium: 2.1 mg/dL (ref 1.6–2.3)

## 2022-02-26 ENCOUNTER — Other Ambulatory Visit (HOSPITAL_BASED_OUTPATIENT_CLINIC_OR_DEPARTMENT_OTHER): Payer: BC Managed Care – PPO

## 2022-02-26 ENCOUNTER — Ambulatory Visit (HOSPITAL_BASED_OUTPATIENT_CLINIC_OR_DEPARTMENT_OTHER)
Admission: RE | Admit: 2022-02-26 | Discharge: 2022-02-26 | Disposition: A | Discharge status: Home / Self Care | Payer: BC Managed Care – PPO | Source: Ambulatory Visit | Attending: Cardiovascular Disease | Admitting: Cardiovascular Disease

## 2022-02-26 DIAGNOSIS — I361 Nonrheumatic tricuspid (valve) insufficiency: Secondary | ICD-10-CM

## 2022-02-26 DIAGNOSIS — I341 Nonrheumatic mitral (valve) prolapse: Secondary | ICD-10-CM | POA: Diagnosis not present

## 2022-02-26 DIAGNOSIS — R002 Palpitations: Secondary | ICD-10-CM | POA: Diagnosis not present

## 2022-02-26 LAB — ECHOCARDIOGRAM COMPLETE
AR max vel: 1.69 cm2
AV Area VTI: 1.72 cm2
AV Area mean vel: 1.5 cm2
AV Mean grad: 6 mmHg
AV Peak grad: 9 mmHg
Ao pk vel: 1.5 m/s
Area-P 1/2: 4.01 cm2
S' Lateral: 3.3 cm

## 2022-02-26 NOTE — Progress Notes (Signed)
?  Echocardiogram ?2D Echocardiogram has been performed. ? ?Elmer Ramp ?02/26/2022, 8:30 AM ?

## 2022-03-02 ENCOUNTER — Encounter: Payer: Self-pay | Admitting: Cardiovascular Disease

## 2022-03-04 DIAGNOSIS — Z20822 Contact with and (suspected) exposure to covid-19: Secondary | ICD-10-CM | POA: Diagnosis not present

## 2022-03-04 DIAGNOSIS — B349 Viral infection, unspecified: Secondary | ICD-10-CM | POA: Diagnosis not present

## 2022-03-04 DIAGNOSIS — R509 Fever, unspecified: Secondary | ICD-10-CM | POA: Diagnosis not present

## 2022-03-05 ENCOUNTER — Telehealth: Payer: Self-pay | Admitting: Cardiovascular Disease

## 2022-03-05 NOTE — Telephone Encounter (Signed)
?  Transferred to [place] for echo result ?

## 2022-03-08 DIAGNOSIS — M47812 Spondylosis without myelopathy or radiculopathy, cervical region: Secondary | ICD-10-CM | POA: Diagnosis not present

## 2022-03-08 DIAGNOSIS — M47817 Spondylosis without myelopathy or radiculopathy, lumbosacral region: Secondary | ICD-10-CM | POA: Diagnosis not present

## 2022-03-08 DIAGNOSIS — M9902 Segmental and somatic dysfunction of thoracic region: Secondary | ICD-10-CM | POA: Diagnosis not present

## 2022-03-08 DIAGNOSIS — M9901 Segmental and somatic dysfunction of cervical region: Secondary | ICD-10-CM | POA: Diagnosis not present

## 2022-04-05 DIAGNOSIS — M9902 Segmental and somatic dysfunction of thoracic region: Secondary | ICD-10-CM | POA: Diagnosis not present

## 2022-04-05 DIAGNOSIS — M9901 Segmental and somatic dysfunction of cervical region: Secondary | ICD-10-CM | POA: Diagnosis not present

## 2022-04-05 DIAGNOSIS — M47812 Spondylosis without myelopathy or radiculopathy, cervical region: Secondary | ICD-10-CM | POA: Diagnosis not present

## 2022-04-05 DIAGNOSIS — M47817 Spondylosis without myelopathy or radiculopathy, lumbosacral region: Secondary | ICD-10-CM | POA: Diagnosis not present

## 2022-04-08 DIAGNOSIS — Z20822 Contact with and (suspected) exposure to covid-19: Secondary | ICD-10-CM | POA: Diagnosis not present

## 2022-04-08 DIAGNOSIS — R509 Fever, unspecified: Secondary | ICD-10-CM | POA: Diagnosis not present

## 2022-04-08 DIAGNOSIS — B349 Viral infection, unspecified: Secondary | ICD-10-CM | POA: Diagnosis not present

## 2022-04-08 DIAGNOSIS — U071 COVID-19: Secondary | ICD-10-CM | POA: Diagnosis not present

## 2022-04-19 DIAGNOSIS — M47817 Spondylosis without myelopathy or radiculopathy, lumbosacral region: Secondary | ICD-10-CM | POA: Diagnosis not present

## 2022-04-19 DIAGNOSIS — M9902 Segmental and somatic dysfunction of thoracic region: Secondary | ICD-10-CM | POA: Diagnosis not present

## 2022-04-19 DIAGNOSIS — M47812 Spondylosis without myelopathy or radiculopathy, cervical region: Secondary | ICD-10-CM | POA: Diagnosis not present

## 2022-04-19 DIAGNOSIS — M9901 Segmental and somatic dysfunction of cervical region: Secondary | ICD-10-CM | POA: Diagnosis not present

## 2022-05-02 ENCOUNTER — Telehealth: Payer: Self-pay

## 2022-05-02 NOTE — Telephone Encounter (Signed)
Patient called in voice mail stating she is patient of Dr. Marguerita Merles and she does not have a PCP but needs to find one.  She states she had Covid a month ago and has recovered from the worse symptoms but still is experiencing SOB, feeling of pressure on her chest and still very tired.  She lives in [place] and would like PCP referral there.  Dr. Marguerita Merles, I would like to advise patient to proceed to [facility] in [facility] to be evaluated there in ER and they can perhaps give her PCP names for [facility]. I am concerned a new patient appointment with PCP will not be soon enough for her symptoms.    Ok?

## 2022-05-02 NOTE — Telephone Encounter (Signed)
Per Dr. Marguerita Merles "Yes, completely agree, recommend ER in [facility]."  I advised patient that Dr. Marguerita Merles recommended ER visit initially to assess. Patient stated she was not going to ER.  She said last time she had pneumonia she went to [facility] and she will go there again.

## 2022-05-09 DIAGNOSIS — H10013 Acute follicular conjunctivitis, bilateral: Secondary | ICD-10-CM | POA: Diagnosis not present

## 2022-06-28 DIAGNOSIS — M9902 Segmental and somatic dysfunction of thoracic region: Secondary | ICD-10-CM | POA: Diagnosis not present

## 2022-06-28 DIAGNOSIS — M47817 Spondylosis without myelopathy or radiculopathy, lumbosacral region: Secondary | ICD-10-CM | POA: Diagnosis not present

## 2022-06-28 DIAGNOSIS — M47812 Spondylosis without myelopathy or radiculopathy, cervical region: Secondary | ICD-10-CM | POA: Diagnosis not present

## 2022-06-28 DIAGNOSIS — M9901 Segmental and somatic dysfunction of cervical region: Secondary | ICD-10-CM | POA: Diagnosis not present

## 2022-07-05 DIAGNOSIS — M47817 Spondylosis without myelopathy or radiculopathy, lumbosacral region: Secondary | ICD-10-CM | POA: Diagnosis not present

## 2022-07-05 DIAGNOSIS — M9901 Segmental and somatic dysfunction of cervical region: Secondary | ICD-10-CM | POA: Diagnosis not present

## 2022-07-05 DIAGNOSIS — M9902 Segmental and somatic dysfunction of thoracic region: Secondary | ICD-10-CM | POA: Diagnosis not present

## 2022-07-05 DIAGNOSIS — M47812 Spondylosis without myelopathy or radiculopathy, cervical region: Secondary | ICD-10-CM | POA: Diagnosis not present

## 2022-08-01 ENCOUNTER — Ambulatory Visit: Payer: BC Managed Care – PPO | Attending: Cardiovascular Disease | Admitting: Cardiovascular Disease

## 2022-08-01 ENCOUNTER — Encounter: Payer: Self-pay | Admitting: Cardiovascular Disease

## 2022-08-01 DIAGNOSIS — I340 Nonrheumatic mitral (valve) insufficiency: Secondary | ICD-10-CM

## 2022-08-01 DIAGNOSIS — R002 Palpitations: Secondary | ICD-10-CM | POA: Diagnosis not present

## 2022-08-01 DIAGNOSIS — I341 Nonrheumatic mitral (valve) prolapse: Secondary | ICD-10-CM

## 2022-08-01 NOTE — Progress Notes (Signed)
Cardiology Office Note    Date:  08/01/2022   ID:  Jessica [name], DOB Feb 12, 1966, MRN [id number]  PCP:  [name], MD  Cardiologist:  Shelva Majestic, MD   5 month F/U evaluation  History of Present Illness:  Jessica [name] is a 56 y.o. female who I saw on Feb 21, 2022 after not having seen her many years ago and she was evaluated for a heart murmur and had noted PVCs.   She is followed by Dr. Waldemar Dickens at the [facility].  Jessica [name] had contacted her with a complaint that she had experienced episodic palpitations with heart racing between 90 and 120 bpm while lying down with each episode lasting for over 2 hours.  As result, she requested that she be referred to me for further cardiology evaluation.  Her father has a history of atrial fibrillation.  Two months ago, after eating pizza and going to bed her heart rate had increased over 100 and lasted at least 2 hours 2 months ago.  She has had a total of 3 episodes over the last several months.  She believes she sleeps well typically goes to bed at 10 pm and often sleeps throughout the night unless she is under significant increased stress and then may experience frequent awakenings.  She is heterozygous for prothrombin gene mutation.  She is not on any medications.  She exercises by doing yoga in the morning and walks her dogs.  She recently had laboratory in February and March 2023 which showed a vitamin D level at 48, stable CBC hemoglobin 14 hematocrit 41.9.  TSH 0.74.  Total cholesterol 185, HDL 93, triglycerides 34, and LDL 81 which was significantly improved from 2021 when her total cholesterol was 209 and LDL 103.  Laboratory on December 11, 2021 showed most likely factitious hyperkalemia at 5.8.  Subsequent laboratory shows normalization of potassium at 4.2 on January 08, 2019.  She denied any chest pain.  She was unaware of any snoring.  She denied daytime sleepiness.  An Epworth Sleepiness Scale  score was calculated in the office today and this endorsed at 3.  During her  May 2023 evaluation I recommended she undergo 2D echo Doppler study for assessment of heart function and valvular architecture.  She had a 1/6 systolic murmur at the left sternal border and apex and felt she may have had some mild mitral regurgitation.  Follow-up laboratory was recommended.  He was given a prescription for metoprolol to tartrate to take 25 mg on a as needed basis if recurrent tachypalpitations develop.  She underwent her echo Doppler study on Feb 26, 2022 which showed normal LV function with a EF 55 to 60% and normal diastolic function.  There was mild late systolic prolapse of the posterior leaflet of the mitral valve with associated trivial mitral regurgitation.  Since I last saw her, she has been entirely asymptomatic.  She denies any recurrent episodes of palpitations.  She continues to try to be active walking she recently did hiking on a 2-week trip to [place] and [place] and felt well.  She has not used any of the metoprolol tartrate.  She presents for follow-up evaluation.   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 (Patient not taking: Reported on 08/01/2022) 90 tablet 3   Multiple Vitamin (MULTIVITAMIN)  tablet Take 1 tablet by mouth daily. (Patient not taking: Reported on 08/01/2022)     No facility-administered medications prior to visit.     Allergies:   Oxycodone-acetaminophen, Propoxyphene, and Codeine   Social History   Socioeconomic History   Marital status: Divorced    Spouse name: Not on file   Number of children: Not on file   Years of education: Not on file   Highest education level: Not on file  Occupational History   Not on file  Tobacco Use   Smoking status: Former    Types: Cigarettes    Quit date: 01/18/2007    Years since quitting: 15.5   Smokeless tobacco: Never  Vaping Use   Vaping Use: Never used  Substance and Sexual Activity   Alcohol use: Not Currently    Alcohol/week: 5.0 standard drinks of alcohol    Types: 5 Glasses of wine per week   Drug use: No   Sexual activity: Not Currently    Birth control/protection: Post-menopausal    Comment: des neg  Other Topics Concern   Not on file  Social History Narrative   Not on file   Social Determinants of Health   Financial Resource Strain: Not on file  Food Insecurity: Not on file  Transportation Needs: Not on file  Physical Activity: Not on file  Stress: Not on file  Social Connections: Not on file    Social history is notable and that she was born in [place].  She is divorced.  She has a son who is age 25.  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 She has remote tobacco history but she quit in 2008.  She drinks occasional wine.  She does yoga and walks her dogs.  Family History:  The patient's family history includes Breast cancer in her maternal aunt and sister; Cancer in her maternal aunt, maternal grandmother, paternal grandfather, paternal uncle, and sister; Colon cancer in her maternal aunt, paternal grandfather, and paternal uncle; Colon polyps in her father and sister; Diabetes in her paternal grandmother.   Her  mother is currently 60 and father 29.  Her father has persistent hypertension.  She has 2 sisters ages 37 and 51 and her 49 year old sister has a history of atrial fibrillation.  ROS General: Negative; No fevers, chills, or night sweats;  HEENT: Negative; No changes in vision or hearing, sinus congestion, difficulty swallowing Pulmonary: Negative; No cough, wheezing, shortness of breath, hemoptysis Cardiovascular: See HPI GI: Negative; No nausea, vomiting, diarrhea, or abdominal pain GU: Negative; No dysuria, hematuria, or difficulty voiding Musculoskeletal: Negative; no myalgias, joint pain, or weakness Hematologic/Oncology: Negative; no easy bruising, bleeding Endocrine: Negative; no  heat/cold intolerance; no diabetes Neuro: Negative; no changes in balance, headaches Skin: Negative; No rashes or skin lesions Psychiatric: Negative; No behavioral problems, depression Sleep: Negative; No snoring, daytime sleepiness, hypersomnolence, bruxism, restless legs, hypnogognic hallucinations, no cataplexy Other comprehensive 14 point system review is negative.   PHYSICAL EXAM:   VS:  BP 114/76 (BP Location: Left Arm, Patient Position: Sitting, Cuff Size: Normal)   Pulse (!) 56   Ht 5' 6.5" (1.689 m)   Wt 146 lb 3.2 oz (66.3 kg)   LMP 10/15/2014   SpO2 98%   BMI 23.24 kg/m     Repeat blood pressure by me was 118/72.  Wt Readings from Last 3 Encounters:  08/01/22 146 lb 3.2 oz (66.3 kg)  02/21/22 145 lb (65.8 kg)  12/11/21 139 lb (63 kg)    General: Alert, oriented, no distress.  Skin: normal turgor, no rashes, warm and dry HEENT: Normocephalic, atraumatic. Pupils equal round and reactive to light; sclera anicteric; extraocular muscles intact;  Nose without nasal septal hypertrophy Mouth/Parynx benign; Mallinpatti scale 2 Neck: No JVD, no carotid bruits; normal carotid upstroke Lungs: clear to ausculatation and percussion; no wheezing or rales Chest wall: without tenderness to  palpitation Heart: PMI not displaced, RRR, s1 s2 normal, 1/6 systolic murmur at the apex consistent with MR, no diastolic murmur, no rubs, gallops, thrills, or heaves Abdomen: soft, nontender; no hepatosplenomehaly, BS+; abdominal aorta nontender and not dilated by palpation. Back: no CVA tenderness Pulses 2+ Musculoskeletal: full range of motion, normal strength, no joint deformities Extremities: no clubbing cyanosis or edema, Homan's sign negative  Neurologic: grossly nonfocal; Cranial nerves grossly wnl Psychologic: Normal mood and affect    Studies/Labs Reviewed:    August 01, 2022 ECG (independently read by me): Sinus bradycardia at 54 bpm.  No ectopy.  Normal intervals.  No ST segment changes.  Feb 21, 2022 ECG (independently read by me): NSR at 65, mild sinus arhythmia , no significant STT change  Recent Labs:    Latest Ref Rng & Units 02/21/2022    2:11 PM 01/07/2022    4:10 PM 12/11/2021    9:41 AM  BMP  Glucose 70 - 99 mg/dL 93   109   BUN 6 - 24 mg/dL 22   17   Creatinine 0.57 - 1.00 mg/dL 0.56   0.67   BUN/Creat Ratio 9 - 23 39   NOT APPLICABLE   Sodium 127 - 144 mmol/L 138   138   Potassium 3.5 - 5.2 mmol/L 4.6  4.2  5.8   Chloride 96 - 106 mmol/L 100   102   CO2 20 - 29 mmol/L 26   28   Calcium 8.7 - 10.2 mg/dL 9.3   9.7         Latest Ref Rng & Units 12/11/2021    9:41 AM 11/23/2020    9:28 AM 11/22/2019    9:08 AM  Hepatic Function  Total Protein 6.1 - 8.1 g/dL 7.0  6.8  7.2   AST 10 - 35 U/L '25  24  22   '$ ALT 6 - 29 U/L '25  18  19   '$ Total Bilirubin 0.2 - 1.2 mg/dL 0.4  0.4  0.4        Latest Ref Rng & Units 12/11/2021    9:41 AM 11/23/2020    9:28 AM 11/22/2019    9:08 AM  CBC  WBC 3.8 - 10.8 Thousand/uL 5.9  5.6  5.5   Hemoglobin 11.7 - 15.5  g/dL 14.0  13.8  13.7   Hematocrit 35.0 - 45.0 % 41.9  40.1  40.6   Platelets 140 - 400 Thousand/uL 222  191  193    Lab Results  Component Value Date   MCV 93.9 12/11/2021   MCV 90.1 11/23/2020   MCV 91.2  11/22/2019   Lab Results  Component Value Date   TSH 0.74 12/11/2021   Lab Results  Component Value Date   HGBA1C 5.4 01/07/2022     BNP No results found for: "BNP"  ProBNP No results found for: "PROBNP"   Lipid Panel     Component Value Date/Time   CHOL 185 12/11/2021 0941   TRIG 34 12/11/2021 0941   HDL 93 12/11/2021 0941   CHOLHDL 2.0 12/11/2021 0941   VLDL 10 11/13/2016 0857   LDLCALC 82 12/11/2021 0941     RADIOLOGY: No results found.   Additional studies/ records that were reviewed today include:  I have reviewed the records of Dr.Lavoie   ECHO: 02/26/2022   1. Left ventricular ejection fraction, by estimation, is 55 to 60%. The  left ventricle has normal function. The left ventricle has no regional  wall motion abnormalities. Left ventricular diastolic parameters were  normal.   2. Right ventricular systolic function is normal. The right ventricular  size is normal.   3. The mitral valve is normal in structure. Trivial mitral valve  regurgitation. No evidence of mitral stenosis. There is mild late systolic  prolapse of the middle scallop of the posterior leaflet of the mitral  valve.   4. The aortic valve is normal in structure. Aortic valve regurgitation is  trivial. No aortic stenosis is present.   5. The inferior vena cava is normal in size with greater than 50%  respiratory variability, suggesting right atrial pressure of 3 mmHg.   ASSESSMENT:    1. MVP (mitral valve prolapse)   2. Late systolic very mild mitral regurgitation   3. Palpitations       PLAN:  Jessica [name] is a very pleasant 56 year old female whom remotely I had seen over 20 years ago when she had complaints of palpitations and was told of having PVCs and a slight cardiac murmur.  She has done well from a cardiovascular standpoint.  She has a remote tobacco history but fortunately quit smoking in 2008.  When I saw her in May 2023 for reestablishment of care over the  previous several months she had experienced 3 episodes of nocturnal palpitations which would last for approximately 2 hours and had noticed heart rates greater than 100.  She felt that most of these were a regular rhythm.  Her initial incident occurred after she had had pizza which undoubtedly had significant salt.  Over the last year she has had purposeful weight loss and admits to at least a 22 pound weight loss.  She does yoga every morning before work and exercises typically walking and walking her dogs.  She was sleeping well and denied any symptoms suggestive of obstructive sleep apnea.  I had reviewed laboratory which revealed elevated potassium level which may have been artifactual with subsequent follow-up being normal at 4.2.  When I saw her, I recommended she undergo a 2D echo Doppler study which I reviewed with her in detail today.  This confirms normal systolic and diastolic function with EF 55 to 60%.  There was mild late systolic prolapse of the posterior leaflet of her mitral valve with trivial MR.  She was given  a prescription for as needed metoprolol tartrate.  She had the prescription filled but never had to use it.  Presently she is stable.  She is asymptomatic.  Her ECG shows sinus rhythm at 54 bpm without ectopy.  Blood pressure is stable.  Clinically she is doing well and encouraged continued activity.  Laboratory was reviewed.  As long as she is stable I will be available on an as-needed basis but will be happy to see her as needed if recurrent symptoms develop.   Medication Adjustments/Labs and Tests Ordered: Current medicines are reviewed at length with the patient today.  Concerns regarding medicines are outlined above.  Medication changes, Labs and Tests ordered today are listed in the Patient Instructions below. Patient Instructions  Medication Instructions:  Your Physician recommend you continue on your current medication as directed.    *If you need a refill on your cardiac  medications before your next appointment, please call your pharmacy*   Lab Work: None ordered today   Testing/Procedures: None ordered today   Follow-Up: At [facility], you and your health needs are our priority.  As part of our continuing mission to provide you with exceptional heart care, we have created designated Provider Care Teams.  These Care Teams include your primary Cardiologist (physician) and Advanced Practice Providers (APPs -  Physician Assistants and Nurse Practitioners) who all work together to provide you with the care you need, when you need it.  We recommend signing up for the patient portal called "MyChart".  Sign up information is provided on this After Visit Summary.  MyChart is used to connect with patients for Virtual Visits (Telemedicine).  Patients are able to view lab/test results, encounter notes, upcoming appointments, etc.  Non-urgent messages can be sent to your provider as well.   To learn more about what you can do with MyChart, go to [email or web address].    Your next appointment:   As needed  The format for your next appointment:   In Person  Provider:   Dr. Claiborne Billings            Signed, Shelva Majestic, MD  08/01/2022 5:52 PM    [facility] [address] [place], [place], [place]  [address] Phone: [phone]

## 2022-08-01 NOTE — Patient Instructions (Signed)
Medication Instructions:  Your Physician recommend you continue on your current medication as directed.    *If you need a refill on your cardiac medications before your next appointment, please call your pharmacy*   Lab Work: None ordered today   Testing/Procedures: None ordered today   Follow-Up: At [facility], you and your health needs are our priority.  As part of our continuing mission to provide you with exceptional heart care, we have created designated Provider Care Teams.  These Care Teams include your primary Cardiologist (physician) and Advanced Practice Providers (APPs -  Physician Assistants and Nurse Practitioners) who all work together to provide you with the care you need, when you need it.  We recommend signing up for the patient portal called "MyChart".  Sign up information is provided on this After Visit Summary.  MyChart is used to connect with patients for Virtual Visits (Telemedicine).  Patients are able to view lab/test results, encounter notes, upcoming appointments, etc.  Non-urgent messages can be sent to your provider as well.   To learn more about what you can do with MyChart, go to [email or web address].    Your next appointment:   As needed  The format for your next appointment:   In Person  Provider:   Dr. Claiborne Billings

## 2022-08-30 ENCOUNTER — Encounter: Payer: Self-pay | Admitting: Gastroenterology

## 2022-08-30 ENCOUNTER — Ambulatory Visit (INDEPENDENT_AMBULATORY_CARE_PROVIDER_SITE_OTHER): Payer: BC Managed Care – PPO | Admitting: Gastroenterology

## 2022-08-30 VITALS — BP 110/70 | HR 65 | Ht 66.5 in | Wt 147.0 lb

## 2022-08-30 DIAGNOSIS — Z86010 Personal history of colonic polyps: Secondary | ICD-10-CM

## 2022-08-30 MED ORDER — SUTAB 1479-225-188 MG PO TABS: 1.0000 | ORAL_TABLET | Freq: Once | ORAL | 0 refills | Status: AC

## 2022-08-30 NOTE — Progress Notes (Signed)
HPI :  56 year old female with a history of colon polyps, family history of colon cancer, endometriosis, here to reestablish care for consideration of surveillance colonoscopy.  I last saw her in July 2019.  Recall she has a history of an advanced sessile serrated polyp removed in 2016.  This led to a surveillance colonoscopy with me in July 2019.  She had some hyperplastic polyps removed from her rectum and I recommended a 5-year follow-up colonoscopy.  She is not having any problems with her bowels at all.  She denies any abdominal pains.  No blood in her stools.  She denies any changes in her health since of last seen her.  She has history of heterozygous prothrombin 2 gene mutation but has never had a blood clot and is not on any anticoagulants.  She has had some palpitations and seen by cardiology, uses metoprolol as needed and states she has not needed to use it at all.  She had an echocardiogram this past May which was normal.  Recall she has an extensive second-degree family of colon cancer. paternal uncle diagnosed with colon cancer at age 47. Maternal aunt diagnosed with colon cancer at age 40s. Paternal grandfather diagnosed with colon cancer at age 6s. Sister diagnosed with breast cancer.   Prior exams:  Colonoscopy 11/11/2014 - prep was "very good", 57m polyp removed at ascending colon removed via hot snare - c/w sessile serrated polyp, small hemorrhoids. Path shows a (1.4cm polyp in size however)   Colonoscopy 04/29/2018: The perianal and digital rectal examinations were normal. - Three sessile polyps were found in the rectum. The polyps were 3 to 4 mm in size. These polyps were removed with a cold snare. Resection and retrieval were complete. - The colon was tortuous which prolonged the exam. - A few small-mouthed diverticula were found in the sigmoid colon. - The exam was otherwise without abnormality.  Surgical [P], rectum, polyp (3) - HYPERPLASTIC POLYP(S).  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 age of onset, young   Cancer Maternal Aunt        COLON   Colon cancer Maternal Aunt    Cancer Paternal Uncle        COLON   Colon cancer Paternal Uncle    Cancer Maternal Grandmother        OVARIAN   Diabetes  Paternal Grandmother    Cancer Paternal Grandfather        COLON   Colon cancer Paternal Grandfather    Social History   Tobacco Use   Smoking status: Former    Types: Cigarettes    Quit date: 01/18/2007    Years since quitting: 15.6   Smokeless tobacco: Never  Vaping Use   Vaping Use: Never used  Substance Use Topics   Alcohol use: Not Currently    Alcohol/week: 5.0 standard drinks of alcohol    Types: 5 Glasses of wine per week   Drug use: No   Current Outpatient Medications  Medication Sig Dispense Refill   metoprolol tartrate (LOPRESSOR) 25 MG tablet Take 1 tablet (25 mg total) by mouth daily as needed. (Patient not taking: Reported on 08/01/2022) 90 tablet 3   Multiple Vitamin (MULTIVITAMIN) tablet Take 1 tablet by mouth daily. (Patient not taking: Reported on 08/01/2022)     No current facility-administered medications for this visit.    Allergies  Allergen Reactions   Oxycodone-Acetaminophen Nausea And Vomiting   Propoxyphene Nausea And Vomiting   Codeine Nausea Only     Review of Systems: All systems reviewed and negative except where noted in HPI.   Lab Results  Component Value Date   WBC 5.9 12/11/2021   HGB 14.0 12/11/2021   HCT 41.9 12/11/2021   MCV 93.9 12/11/2021   PLT 222 12/11/2021    Lab Results  Component Value Date   CREATININE 0.56 (L) 02/21/2022   BUN 22 02/21/2022   NA 138 02/21/2022   K 4.6 02/21/2022   CL 100 02/21/2022   CO2 26 02/21/2022    Lab Results  Component Value Date   ALT 25 12/11/2021   AST 25 12/11/2021   ALKPHOS 34 11/13/2016   BILITOT 0.4 12/11/2021    Physical Exam: BP 110/70   Pulse 65   Ht 5' 6.5" (1.689 m)   Wt 147 lb (66.7 kg)   LMP 10/15/2014   BMI 23.37 kg/m  Constitutional: Pleasant,well-developed, female in no acute distress. Cardiovascular: Normal rate, regular rhythm.  Pulmonary/chest: Effort normal and breath sounds normal.  Abdominal: Soft, nondistended, nontender. There are no masses palpable.  Extremities: no edema Lymphadenopathy: No cervical adenopathy noted. Neurological: Alert and oriented to person place and time. Skin: Skin is warm and dry. No rashes noted. Psychiatric: Normal mood and affect. Behavior is normal.   ASSESSMENT: 56 y.o. female here for assessment of the following  1. History of colon polyps    As above, patient due for surveillance colonoscopy July 2024 given history of advanced sessile serrated polyp removed in 2017.  She states she is hoping to do it before the end of this year due to financial reasons, states she had spoken about this with her insurance company who was okay with that given she is going to be due in the upcoming months anyway.  I discussed risk benefits of colonoscopy and anesthesia with her, she wants to proceed.  All questions answered  PLAN: - schedule for surveillance colonoscopy at the  [place], MD [facility]

## 2022-08-30 NOTE — Patient Instructions (Addendum)
_______________________________________________________  If you are age 56 or older, your body mass index should be between 23-30. Your Body mass index is 23.37 kg/m. If this is out of the aforementioned range listed, please consider follow up with your Primary Care Provider.  If you are age 36 or younger, your body mass index should be between 19-25. Your Body mass index is 23.37 kg/m. If this is out of the aformentioned range listed, please consider follow up with your Primary Care Provider.   ________________________________________________________  The [facility] providers would like to encourage you to use [facility] to communicate with providers for non-urgent requests or questions.  Due to long hold times on the telephone, sending your provider a message by [facility] may be a faster and more efficient way to get a response.  Please allow 48 business hours for a response.  Please remember that this is for non-urgent requests.  _______________________________________________________  Jessica [name] have been scheduled for a colonoscopy. Please follow written instructions given to you at your visit today.  Please pick up your prep supplies at the pharmacy within the next 1-3 days. If you use inhalers (even only as needed), please bring them with you on the day of your procedure.  Thank you for entrusting me with your care and for choosing [facility], Dr. Sussex Cellar

## 2022-10-01 ENCOUNTER — Encounter: Payer: Self-pay | Admitting: Obstetrics & Gynecology

## 2022-10-01 NOTE — Telephone Encounter (Signed)
Patient left message on surgery line requesting return call. Call returned to patient, left detailed message, ok per dpr. Call was ended mid message.   MyChart message to patient.

## 2022-10-01 NOTE — Telephone Encounter (Signed)
Please provide Rx if you approved boric acid supp.

## 2022-10-03 ENCOUNTER — Encounter: Payer: Self-pay | Admitting: Gastroenterology

## 2022-10-07 ENCOUNTER — Ambulatory Visit (INDEPENDENT_AMBULATORY_CARE_PROVIDER_SITE_OTHER): Payer: BC Managed Care – PPO | Admitting: Podiatry

## 2022-10-07 ENCOUNTER — Telehealth: Payer: Self-pay | Admitting: Podiatry

## 2022-10-07 ENCOUNTER — Ambulatory Visit: Payer: BC Managed Care – PPO

## 2022-10-07 ENCOUNTER — Ambulatory Visit (INDEPENDENT_AMBULATORY_CARE_PROVIDER_SITE_OTHER): Payer: BC Managed Care – PPO

## 2022-10-07 ENCOUNTER — Encounter: Payer: Self-pay | Admitting: Podiatry

## 2022-10-07 DIAGNOSIS — M2041 Other hammer toe(s) (acquired), right foot: Secondary | ICD-10-CM

## 2022-10-07 DIAGNOSIS — M47814 Spondylosis without myelopathy or radiculopathy, thoracic region: Secondary | ICD-10-CM | POA: Diagnosis not present

## 2022-10-07 DIAGNOSIS — M9902 Segmental and somatic dysfunction of thoracic region: Secondary | ICD-10-CM | POA: Diagnosis not present

## 2022-10-07 DIAGNOSIS — M9901 Segmental and somatic dysfunction of cervical region: Secondary | ICD-10-CM | POA: Diagnosis not present

## 2022-10-07 DIAGNOSIS — M47812 Spondylosis without myelopathy or radiculopathy, cervical region: Secondary | ICD-10-CM | POA: Diagnosis not present

## 2022-10-07 NOTE — Telephone Encounter (Signed)
DOS: 10/15/2022  BCBS  Hammertoe Repair Distal 4th B/L ([id number])  Deductible: $4,000 with $160.39 remaining Out-of-Pocket: $4,000 with $160.39 remaining CoInsurance: 0%  Prior authorization is not required per Jethro Bolus.  Call Reference #: [id number]

## 2022-10-07 NOTE — Progress Notes (Signed)
Subjective:   Patient ID: Jessica [name], female   DOB: 56 y.o.   MRN: [id number]   HPI Patient presents stating the fourth toes on both my feet really bother me I have to wear Band-Aids all the time I tried wider shoes I tried cushioning them and padding without relief and they are becoming more more of an issue over the last 6 months to a year   ROS      Objective:  Physical Exam  Neurovascular status intact with patient found overall to be doing very well from previous foot surgery but has significant distal rotation digit for both feet with keratotic lesion and redness and pain around the distal to phalangeal joint     Assessment:  Hammertoe deformity fourth digit both feet painful when pressed     Plan:  H&P reviewed condition and x-rays at great length discussed treatment options and patient due to the longstanding nature failure to respond conservatively is opted for surgical intervention.  I recommended distal derotational arthroplasty digits for both feet she needs to get it done soon due to her schedule and I allowed her to read consent form going over procedure and alternative treatments.  Patient wants surgery understands all risk signed consent form scheduled for outpatient surgery understands it can take upwards of 6 months for all swelling to go away and she is comfortable with this scheduled for outpatient surgery with all questions answered  X-rays indicate significant distal deformity of digit for both feet

## 2022-10-10 ENCOUNTER — Encounter: Payer: Self-pay | Admitting: Gastroenterology

## 2022-10-10 ENCOUNTER — Ambulatory Visit (AMBULATORY_SURGERY_CENTER): Payer: BC Managed Care – PPO | Admitting: Gastroenterology

## 2022-10-10 ENCOUNTER — Other Ambulatory Visit: Payer: Self-pay | Admitting: Gastroenterology

## 2022-10-10 VITALS — BP 121/54 | HR 106 | Temp 97.8°F | Resp 15 | Ht 66.5 in | Wt 147.0 lb

## 2022-10-10 DIAGNOSIS — Z86010 Personal history of colonic polyps: Secondary | ICD-10-CM | POA: Diagnosis not present

## 2022-10-10 DIAGNOSIS — Z1211 Encounter for screening for malignant neoplasm of colon: Secondary | ICD-10-CM | POA: Diagnosis not present

## 2022-10-10 DIAGNOSIS — D123 Benign neoplasm of transverse colon: Secondary | ICD-10-CM | POA: Diagnosis not present

## 2022-10-10 DIAGNOSIS — Z09 Encounter for follow-up examination after completed treatment for conditions other than malignant neoplasm: Secondary | ICD-10-CM

## 2022-10-10 MED ORDER — SODIUM CHLORIDE 0.9 % IV SOLN: 500.0000 mL | Freq: Once | INTRAVENOUS | Status: DC

## 2022-10-10 NOTE — Patient Instructions (Signed)
YOU HAD AN ENDOSCOPIC PROCEDURE TODAY AT THE [facility]:   Refer to the procedure report that was given to you for any specific questions about what was found during the examination.  If the procedure report does not answer your questions, please call your gastroenterologist to clarify.  If you requested that your care partner not be given the details of your procedure findings, then the procedure report has been included in a sealed envelope for you to review at your convenience later.  YOU SHOULD EXPECT: Some feelings of bloating in the abdomen. Passage of more gas than usual.  Walking can help get rid of the air that was put into your GI tract during the procedure and reduce the bloating. If you had a lower endoscopy (such as a colonoscopy or flexible sigmoidoscopy) you may notice spotting of blood in your stool or on the toilet paper. If you underwent a bowel prep for your procedure, you may not have a normal bowel movement for a few days.  Please Note:  You might notice some irritation and congestion in your nose or some drainage.  This is from the oxygen used during your procedure.  There is no need for concern and it should clear up in a day or so.  SYMPTOMS TO REPORT IMMEDIATELY:  Following lower endoscopy (colonoscopy or flexible sigmoidoscopy):  Excessive amounts of blood in the stool  Significant tenderness or worsening of abdominal pains  Swelling of the abdomen that is new, acute  Fever of 100F or higher  For urgent or emergent issues, a gastroenterologist can be reached at any hour by calling [phone]. Do not use MyChart messaging for urgent concerns.    DIET:  We do recommend a small meal at first, but then you may proceed to your regular diet.  Drink plenty of fluids but you should avoid alcoholic beverages for 24 hours.  ACTIVITY:  You should plan to take it easy for the rest of today and you should NOT DRIVE or use heavy machinery until tomorrow (because of  the sedation medicines used during the test).    FOLLOW UP: Our staff will call the number listed on your records the next business day following your procedure.  We will call around 7:15- 8:00 am to check on you and address any questions or concerns that you may have regarding the information given to you following your procedure. If we do not reach you, we will leave a message.     If any biopsies were taken you will be contacted by phone or by letter within the next 1-3 weeks.  Please call us at [phone] if you have not heard about the biopsies in 3 weeks.    SIGNATURES/CONFIDENTIALITY: You and/or your care partner have signed paperwork which will be entered into your electronic medical record.  These signatures attest to the fact that that the information above on your After Visit Summary has been reviewed and is understood.  Full responsibility of the confidentiality of this discharge information lies with you and/or your care-partner.  

## 2022-10-10 NOTE — Progress Notes (Signed)
AFTER VISIT SUMMARY Jessica [name]  Date of Birth: [birth year]  10/10/2022  2:00 PM   Jessica [name] [phone] Instructions  from Jessica [name]  YOU HAD AN ENDOSCOPIC PROCEDURE TODAY AT [place]:   Refer to the procedure report that was given to you for any specific questions about what was found during the examination.  If the procedure report does not answer your questions, please call your gastroenterologist to clarify.  If you requested that your care partner not be given the details of your procedure findings, then the procedure report has been included in a sealed envelope for you to review at your convenience later.   YOU SHOULD EXPECT: Some feelings of bloating in the abdomen. Passage of more gas than usual.  Walking can help get rid of the air that was put into your GI tract during the procedure and reduce the bloating. If you had a lower endoscopy (such as a colonoscopy or flexible sigmoidoscopy) you may notice spotting of blood in your stool or on the toilet paper. If you underwent a bowel prep for your procedure, you may not have a normal bowel movement for a few days.   Please Note:  You might notice some irritation and congestion in your nose or some drainage.  This is from the oxygen used during your procedure.  There is no need for concern and it should clear up in a day or so.   SYMPTOMS TO REPORT IMMEDIATELY:   Following lower endoscopy (colonoscopy or flexible sigmoidoscopy):             Excessive amounts of blood in the stool             Significant tenderness or worsening of abdominal pains             Swelling of the abdomen that is new, acute             Fever of 100F or higher   For urgent or emergent issues, a gastroenterologist can be reached at any hour by calling [phone]. Do not use MyChart messaging for urgent concerns.      DIET:  We do recommend a small meal at first, but then you may proceed to your  regular diet.  Drink plenty of fluids but you should avoid alcoholic beverages for 24 hours.   ACTIVITY:  You should plan to take it easy for the rest of today and you should NOT DRIVE or use heavy machinery until tomorrow (because of the sedation medicines used during the test).     FOLLOW UP: Our staff will call the number listed on your records the next business day following your procedure.  We will call around 7:15- 8:00 am to check on you and address any questions or concerns that you may have regarding the information given to you following your procedure. If we do not reach you, we will leave a message.      If any biopsies were taken you will be contacted by phone or by letter within the next 1-3 weeks.  Please call us at [phone] if you have not heard about the biopsies in 3 weeks.      SIGNATURES/CONFIDENTIALITY: You and/or your care partner have signed paperwork which will be entered into your electronic medical record.  These signatures attest to the fact that that the information above on your After Visit Summary has been reviewed and is understood.  Full responsibility of  the confidentiality of this discharge information lies with you and/or your care-partner. Today's Visit You saw Jessica [name] on Thursday October 10, 2022. 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  Blood Pressure 114/52  BMI 23.37  Weight 147 lb  Height 5' 6.5"  Temperature (Temporal) 97.8 F  Pulse 102  Respiration 14  Oxygen Saturation 99% Done Today COLONOSCOPY Hypoglycemia/Hyperglycemia protocol for History of colon polyps Emergency Medications for History of colon polyps ECG and pulse monitoring for History of colon polyps Monitor NIBP for History of colon polyps Notify physician for History of colon polyps Monitor O2 SATs for History of colon polyps NPO status for History of colon polyps Vital signs for  History of colon polyps Monitor NIBP, ECG and PSA02 for History of colon polyps Notify physician for History of colon polyps Discharge instructions for History of colon polyps Colonoscopy: verify informed consent for History of colon polyps Discharge home with responsible adult. 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, [place] [address]  Phone: [phone]    CASTINGSCAN Date: Wednesday November 06, 2022  Time: 9:00 AM  Where to go: [facility] [place] [facility]  Address: [address] [place] [address]  Phone: [phone]    Jessica [name] With: Jessica Bruins, [name]  Date: Friday December 13, 2022  Time: 9:00 AM  Where to go: Jessica [name]  Address: [address], [place] [address]  Phone: [phone]     Your Primary Care Provider Jessica Bruins, [name]   Allergies Allergen Reactions Comments  Oxycodone-acetaminophen Nausea And Vomiting   Propoxyphene Nausea And Vomiting   Codeine Nausea Only    [name], [place] - [id number] [facility]  Covid-19 Vaccination For facts/information regarding the Covid-19 vaccines, to schedule an appointment, or find walk-in vaccine clinic locations, visit Jessica [name] or use the following link: Get the Facts: Covid-19 Vaccines. Advance Care Planning Information Advance care planning is a way to make decisions about medical care that fits your values in case you are ever unable to make these decisions for yourself.  Login to MyChart for advance care planning support such as additional information on advance care planning, view your  advance care planning documents on file in your medical record, get resources on having advance care planning conversations, download forms or schedule an appointment with your healthcare provider. Suicide Resources   Who to Call Call Jessica [name] at [phone]; [phone]   More Resources Suicide Awareness Voices of Education       215-881-7662        www.save.[place] on Mental Illness(NAMI)       (800) 950-NAMI        www.nami.org American Association of Suicidology       8328216121        www.suicidology.org MyChart We now offer e-Visits for anyone 98 and older to request care online for non-urgent symptoms. For details visit [email or web address].   Also download the MyChart app! Go to the app  store, search "MyChart", open the app, select [place], and log in with your MyChart username and password. Your Medication List  as of October 10, 2022  2:44 PM  suggestion  Always use your most recent med list. Your Medication List metoprolol tartrate 25 MG tablet Commonly known as: LOPRESSOR Take 1 tablet (25 mg total) by mouth daily as needed.  multivitamin tablet Take 1 tablet by mouth daily.

## 2022-10-10 NOTE — Progress Notes (Signed)
[facility] History and Physical   Primary Care Physician:  [name], MD   Reason for Procedure:   History of colon polyps  Plan:    colonoscopy     HPI: Jessica [name] is a 56 y.o. female  here for colonoscopy surveillance - advanced polyp removed in 2016, last exam in 2019 looked okay.  . Patient denies any bowel symptoms at this time. Has 3 second degree relatives with colon cancer. Otherwise feels well without any cardiopulmonary symptoms.   I have discussed risks / benefits of anesthesia and endoscopic procedure with Redgie Grayer and they wish to proceed with the exams as outlined today.    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 Authorizing Provider  metoprolol tartrate (LOPRESSOR) 25 MG tablet Take 1 tablet (25 mg total) by mouth daily as needed. Patient not taking: Reported on 10/10/2022 02/21/22 02/16/23  Troy Sine, MD  Multiple Vitamin (MULTIVITAMIN) tablet Take 1 tablet by mouth daily.    [provider]    Current Outpatient Medications  Medication Sig Dispense Refill   metoprolol tartrate (LOPRESSOR) 25 MG tablet Take 1 tablet (25 mg total) by mouth daily as needed. (Patient not taking: Reported on 10/10/2022) 90 tablet 3   Multiple Vitamin (MULTIVITAMIN) tablet Take 1  tablet by mouth daily.     Current Facility-Administered Medications  Medication Dose Route Frequency Provider Last Rate Last Admin   0.9 %  sodium chloride infusion  500 mL Intravenous Once Eldar Robitaille, Carlota Raspberry, MD        Allergies as of 10/10/2022 - Review Complete 10/10/2022  Allergen Reaction Noted   Oxycodone-acetaminophen Nausea And Vomiting 03/12/2016   Propoxyphene Nausea And Vomiting 03/12/2016   Codeine Nausea Only 02/27/2011    Family History  Problem Relation Age of Onset   Colon polyps Father    Cancer Sister        breast   Breast cancer Sister        47's   Colon polyps Sister    Breast cancer Maternal Aunt        ? age of onset, young   Cancer Maternal Aunt        COLON   Colon cancer Maternal Aunt    Rectal cancer Paternal Uncle        colorectal cancer   Cancer Paternal Uncle        COLON   Colon cancer Paternal Uncle    Cancer Maternal Grandmother        OVARIAN   Diabetes Paternal Grandmother    Cancer Paternal Grandfather  COLON   Colon cancer Paternal Grandfather    Esophageal cancer Neg Hx    Stomach cancer Neg Hx     Social History   Socioeconomic History   Marital status: Divorced    Spouse name: Not on file   Number of children: Not on file   Years of education: Not on file   Highest education level: Not on file  Occupational History   Not on file  Tobacco Use   Smoking status: Former    Types: Cigarettes    Quit date: 01/18/2007    Years since quitting: 15.7   Smokeless tobacco: Never  Vaping Use   Vaping Use: Never used  Substance and Sexual Activity   Alcohol use: Not Currently    Alcohol/week: 5.0 standard drinks of alcohol    Types: 5 Glasses of wine per week    Comment: occassional   Drug use: No   Sexual activity: Not Currently    Birth control/protection: Post-menopausal    Comment: des neg  Other Topics Concern   Not on file  Social History Narrative   Not on file   Social Determinants of Health    Financial Resource Strain: Not on file  Food Insecurity: Not on file  Transportation Needs: Not on file  Physical Activity: Not on file  Stress: Not on file  Social Connections: Not on file  Intimate Partner Violence: Not on file    Review of Systems: All other review of systems negative except as mentioned in the HPI.  Physical Exam: Vital signs BP 114/70   Pulse 65   Temp 97.8 F (36.6 C) (Temporal)   Ht 5' 6.5" (1.689 m)   Wt 147 lb (66.7 kg)   LMP 10/15/2014   SpO2 100%   BMI 23.37 kg/m   General:   Alert,  Well-developed, pleasant and cooperative in NAD Lungs:  Clear throughout to auscultation.   Heart:  Regular rate and rhythm Abdomen:  Soft, nontender and nondistended.   Neuro/Psych:  Alert and cooperative. Normal mood and affect. A and O x 3  Jolly Mango, MD [facility]

## 2022-10-10 NOTE — Progress Notes (Signed)
VSS, transported to PACU °

## 2022-10-10 NOTE — Progress Notes (Signed)
Called to room to assist during endoscopic procedure.  Patient ID and intended procedure confirmed with present staff. Received instructions for my participation in the procedure from the performing physician.  

## 2022-10-10 NOTE — Op Note (Signed)
[facility] Patient Name: Jessica [name] Procedure Date: 10/10/2022 1:49 PM MRN: [id number] Endoscopist: Remo Lipps P. Havery Moros , MD, [id number] Age: 56 Referring MD:  Date of Birth: 1965-11-12 Gender: Female Account #: [id number] Procedure:                Colonoscopy Indications:              High risk colon cancer surveillance: Personal                            history of colonic polyps - advanced SSP removed                            2016, last exam early 2019 without pre-cancerous                            polyps. Numerous second degree relatives with colon                            cancer. Medicines:                Monitored Anesthesia Care Procedure:                Pre-Anesthesia Assessment:                           - Prior to the procedure, a History and Physical                            was performed, and patient medications and                            allergies were reviewed. The patient's tolerance of                            previous anesthesia was also reviewed. The risks                            and benefits of the procedure and the sedation                            options and risks were discussed with the patient.                            All questions were answered, and informed consent                            was obtained. Prior Anticoagulants: The patient has                            taken no anticoagulant or antiplatelet agents. ASA                            Grade Assessment: II - A patient with mild systemic  disease. After reviewing the risks and benefits,                            the patient was deemed in satisfactory condition to                            undergo the procedure.                           After obtaining informed consent, the colonoscope                            was passed under direct vision. Throughout the                            procedure, the patient's blood pressure, pulse,  and                            oxygen saturations were monitored continuously. The                            PCF-HQ190L Colonoscope was introduced through the                            anus and advanced to the the cecum, identified by                            appendiceal orifice and ileocecal valve. The                            colonoscopy was performed without difficulty. The                            patient tolerated the procedure well. The quality                            of the bowel preparation was good. The ileocecal                            valve, appendiceal orifice, and rectum were                            photographed. Scope In: 2:01:39 PM Scope Out: 2:23:28 PM Scope Withdrawal Time: 0 hours 17 minutes 29 seconds  Total Procedure Duration: 0 hours 21 minutes 49 seconds  Findings:                 The perianal and digital rectal examinations were                            normal.                           A 4 mm polyp was found in the hepatic flexure. The  polyp was flat. The polyp was removed with a cold                            snare. Resection and retrieval were complete.                           Internal hemorrhoids were found during                            retroflexion. The hemorrhoids were small.                           Anal papilla(e) were hypertrophied.                           The exam was otherwise normal throughout the                            examined colon. Complications:            No immediate complications. Estimated blood loss:                            Minimal. Estimated Blood Loss:     Estimated blood loss was minimal. Impression:               - One 4 mm polyp at the hepatic flexure, removed                            with a cold snare. Resected and retrieved.                           - Internal hemorrhoids.                           - Anal papilla(e) were hypertrophied. Recommendation:           -  Patient has a contact number available for                            emergencies. The signs and symptoms of potential                            delayed complications were discussed with the                            patient. Return to normal activities tomorrow.                            Written discharge instructions were provided to the                            patient.                           - Resume previous diet.                           -  Continue present medications.                           - Await pathology results.                           - Anticipate repeat colonoscopy in 5 years for                            surveillance. Remo Lipps P. Havery Moros, MD 10/10/2022 2:28:53 PM This report has been signed electronically.

## 2022-10-11 ENCOUNTER — Telehealth: Payer: Self-pay

## 2022-10-11 NOTE — Telephone Encounter (Signed)
  Follow up Call-     10/10/2022    1:13 PM  Call back number  Post procedure Call Back phone  # [phone]  Permission to leave phone message Yes     Patient questions:  Do you have a fever, pain , or abdominal swelling? No. Pain Score  0 *  Have you tolerated food without any problems? Yes.    Have you been able to return to your normal activities? Yes.    Do you have any questions about your discharge instructions: Diet   No. Medications  No. Follow up visit  No.  Do you have questions or concerns about your Care? No.  Actions: * If pain score is 4 or above: No action needed, pain <4.

## 2022-10-14 MED ORDER — HYDROCODONE-ACETAMINOPHEN 10-325 MG PO TABS: 1.0000 | ORAL_TABLET | Freq: Three times a day (TID) | ORAL | 0 refills | Status: DC | PRN

## 2022-10-14 NOTE — Addendum Note (Signed)
Addended by: Wallene Huh on: 10/14/2022 11:31 PM   Modules accepted: Orders

## 2022-10-15 ENCOUNTER — Other Ambulatory Visit: Payer: Self-pay | Admitting: Podiatry

## 2022-10-15 ENCOUNTER — Telehealth: Payer: Self-pay | Admitting: Podiatry

## 2022-10-15 DIAGNOSIS — M2041 Other hammer toe(s) (acquired), right foot: Secondary | ICD-10-CM | POA: Diagnosis not present

## 2022-10-15 DIAGNOSIS — M2042 Other hammer toe(s) (acquired), left foot: Secondary | ICD-10-CM | POA: Diagnosis not present

## 2022-10-15 MED ORDER — TRAMADOL HCL 50 MG PO TABS: 50.0000 mg | ORAL_TABLET | Freq: Three times a day (TID) | ORAL | 0 refills | Status: AC | PRN

## 2022-10-15 NOTE — Telephone Encounter (Signed)
Pt called and had surgery this morning and the pain medication that was sent in pt cannot take, She cannot take anything with codeine or a derivative of codeine and the medication that was sent in has a derivative of codeine per the pharmacist.  She stated she does not need anything to strong just something a little stronger than ibuprofen. She uses the [facility] in [place].

## 2022-10-15 NOTE — Telephone Encounter (Signed)
Notified pt that tramadol was sent in to the pharmacy for her. She said thank you and she would have her son pick it up.  She did ask if the block should have worn off by now and I told her I was not clinical and could not give her that information. She understood but stated she has only taken 2 dose of advil but will have the other medication incase she needs it.

## 2022-10-15 NOTE — Telephone Encounter (Signed)
Pt called checking status of medication as she wants to get it before her block wears off. She has taken some advil but would like the pain medication to have incase needed. Please call pt when sent in.

## 2022-10-16 NOTE — Telephone Encounter (Signed)
Spoke to the patient and she states that she is feeling much better this morning.

## 2022-10-16 NOTE — Telephone Encounter (Signed)
Jessica [name], call and check on her

## 2022-10-23 ENCOUNTER — Ambulatory Visit (INDEPENDENT_AMBULATORY_CARE_PROVIDER_SITE_OTHER): Payer: BC Managed Care – PPO

## 2022-10-23 VITALS — BP 106/72 | HR 64

## 2022-10-23 DIAGNOSIS — M2041 Other hammer toe(s) (acquired), right foot: Secondary | ICD-10-CM

## 2022-10-23 DIAGNOSIS — M2042 Other hammer toe(s) (acquired), left foot: Secondary | ICD-10-CM | POA: Diagnosis not present

## 2022-10-23 NOTE — Progress Notes (Signed)
Patient presents today for post op visit # 1, patient of Dr. Paulla Dolly.   POV # 1 DOS 10/15/22 HAMMERTOE REPAIR 4TH DISTAL B/L    Patient presents in their surgical shoes. Denies any falls or injury to the foot. 4th toes are swollen. No signs of infection. No calf pain or shortness of breath. Bandages dry and intact. Incision is intact.  Advised patient that she can begin to get the surgical site wet but do not soak or scrub the foot. Patient verbalized understanding.    BP: 106/72 P: 64    Xrays taken today and reviewed by Dr. Paulla Dolly. He did take a look at her foot today as well.   Foot redressed today and placed back in the surgical shoes. Reviewed icing and elevation. Patient will follow up with Dr. Paulla Dolly next 2 weeks for POV# 2 and suture removal.

## 2022-10-31 ENCOUNTER — Encounter: Payer: Self-pay | Admitting: Podiatry

## 2022-11-06 ENCOUNTER — Ambulatory Visit (INDEPENDENT_AMBULATORY_CARE_PROVIDER_SITE_OTHER): Payer: BC Managed Care – PPO

## 2022-11-06 VITALS — BP 110/72 | HR 81

## 2022-11-06 DIAGNOSIS — M2041 Other hammer toe(s) (acquired), right foot: Secondary | ICD-10-CM

## 2022-11-06 NOTE — Progress Notes (Signed)
Patient presents today for post op visit # 2, patient of Dr. Paulla Dolly.   POV # 2 DOS 10/15/22 [employer] 4th distal B/L    Patient presents in their surgical shoe. Denies any falls or injury to the foot. 4th toes are swollen. No signs of infection. No calf pain or shortness of breath. Bandages dry and intact. Incision is intact.    BP: 110/72 P: 81     He did take a look at her foot today as well.   Reviewed icing and elevation. Patient will follow up with Dr. Paulla Dolly as needed.

## 2022-11-27 DIAGNOSIS — M5127 Other intervertebral disc displacement, lumbosacral region: Secondary | ICD-10-CM | POA: Diagnosis not present

## 2022-11-27 DIAGNOSIS — M5386 Other specified dorsopathies, lumbar region: Secondary | ICD-10-CM | POA: Diagnosis not present

## 2022-11-27 DIAGNOSIS — M47812 Spondylosis without myelopathy or radiculopathy, cervical region: Secondary | ICD-10-CM | POA: Diagnosis not present

## 2022-11-27 DIAGNOSIS — M47814 Spondylosis without myelopathy or radiculopathy, thoracic region: Secondary | ICD-10-CM | POA: Diagnosis not present

## 2022-11-29 ENCOUNTER — Other Ambulatory Visit: Payer: Self-pay | Admitting: Podiatry

## 2022-11-29 ENCOUNTER — Ambulatory Visit (INDEPENDENT_AMBULATORY_CARE_PROVIDER_SITE_OTHER): Payer: BC Managed Care – PPO

## 2022-11-29 ENCOUNTER — Ambulatory Visit (INDEPENDENT_AMBULATORY_CARE_PROVIDER_SITE_OTHER): Payer: BC Managed Care – PPO | Admitting: Podiatry

## 2022-11-29 ENCOUNTER — Encounter: Payer: Self-pay | Admitting: Podiatry

## 2022-11-29 DIAGNOSIS — M2042 Other hammer toe(s) (acquired), left foot: Secondary | ICD-10-CM

## 2022-11-29 DIAGNOSIS — Z9889 Other specified postprocedural states: Secondary | ICD-10-CM

## 2022-11-29 DIAGNOSIS — M2041 Other hammer toe(s) (acquired), right foot: Secondary | ICD-10-CM | POA: Diagnosis not present

## 2022-11-29 NOTE — Progress Notes (Signed)
Subjective:   Patient ID: Jessica [name], female   DOB: 57 y.o.   MRN: [id number]   HPI Patient presents stating that the right 1 is hurting at this point stating that she was doing well but she wore a tighter shoe without socks   ROS      Objective:  Physical Exam  Neurovascular status intact negative Bevelyn Buckles' sign noted digits overall are healing well there are some mild swelling still of the fourth toes bilateral and she did have severe structural malalignment preoperatively     Assessment:  Toes that are still having some pathology but overall I do think she continues to improve and only 6 weeks after surgery     Plan:  Reviewed condition and explained this to her applied compression to the toe and if it still doing this in 4 to 6 weeks we will need to consider injection treatment  X-rays do indicate satisfactory resection of bone bilateral fourth toe

## 2022-12-02 DIAGNOSIS — J0100 Acute maxillary sinusitis, unspecified: Secondary | ICD-10-CM | POA: Diagnosis not present

## 2022-12-13 ENCOUNTER — Ambulatory Visit: Payer: BC Managed Care – PPO | Admitting: Obstetrics & Gynecology

## 2022-12-16 ENCOUNTER — Other Ambulatory Visit (HOSPITAL_COMMUNITY)
Admission: RE | Admit: 2022-12-16 | Discharge: 2022-12-16 | Disposition: A | Discharge status: Home / Self Care | Payer: BC Managed Care – PPO | Source: Ambulatory Visit | Attending: Obstetrics & Gynecology | Admitting: Obstetrics & Gynecology

## 2022-12-16 ENCOUNTER — Ambulatory Visit (INDEPENDENT_AMBULATORY_CARE_PROVIDER_SITE_OTHER): Payer: BC Managed Care – PPO | Admitting: Obstetrics & Gynecology

## 2022-12-16 ENCOUNTER — Encounter: Payer: Self-pay | Admitting: Obstetrics & Gynecology

## 2022-12-16 VITALS — BP 122/82 | HR 67 | Ht 66.5 in | Wt 150.0 lb

## 2022-12-16 DIAGNOSIS — Z01419 Encounter for gynecological examination (general) (routine) without abnormal findings: Secondary | ICD-10-CM | POA: Insufficient documentation

## 2022-12-16 DIAGNOSIS — M85852 Other specified disorders of bone density and structure, left thigh: Secondary | ICD-10-CM

## 2022-12-16 DIAGNOSIS — B009 Herpesviral infection, unspecified: Secondary | ICD-10-CM

## 2022-12-16 DIAGNOSIS — Z780 Asymptomatic menopausal state: Secondary | ICD-10-CM

## 2022-12-16 MED ORDER — VALACYCLOVIR HCL 1 G PO TABS: 1000.0000 mg | ORAL_TABLET | Freq: Two times a day (BID) | ORAL | 3 refills | Status: DC

## 2022-12-16 NOTE — Progress Notes (Signed)
Jessica [name] 24-Jan-1966 [id number]   History:    57 y.o. G2P1A1L1 Single.  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   RP:  Established patient presenting for annual gyn exam    HPI: Postmenopause, well on no HRT.  No PMB.  No pelvic pain.  Abstinent.  H/O Genital HSV, occasional recurrences. Pap Neg/HPV HR neg in 11/2020. Pap reflex today. Breasts normal.  Mammo neg 12/2021, will schedule mammo now.  BMI 23.85.  Doing Yoga. BMD Osteopenia T-Score -1.3 stable on 01-16-21.  COLONOSCOPY: 09/2022.  Has had the Flu vaccine.  Wants to establish with a Fam MD.  Will f/u here for Health labs this year.  Past medical history,surgical history, family history and social history were all reviewed and documented in the EPIC chart.  Gynecologic History Patient's last menstrual period was 10/15/2014.  Obstetric History OB History  Gravida Para Term Preterm AB Living  2 1 0   1 1  SAB IAB Ectopic Multiple Live Births  1            # Outcome Date GA Lbr Len/2nd Weight Sex Delivery Anes PTL Lv  2 SAB           1 Para     M Vag-Spont        ROS: A ROS was performed and pertinent positives and negatives are included in the history. GENERAL: No fevers or chills. HEENT: No change in vision, no earache, sore throat or sinus congestion. NECK: No pain or stiffness. CARDIOVASCULAR: No chest pain or pressure. No palpitations. PULMONARY: No shortness of breath, cough or wheeze. GASTROINTESTINAL: No abdominal pain, nausea, vomiting or diarrhea, melena or bright red blood per rectum. GENITOURINARY: No urinary frequency, urgency, hesitancy or dysuria. MUSCULOSKELETAL: No joint or muscle pain, no back pain, no recent trauma. DERMATOLOGIC: No rash, no itching, no lesions. ENDOCRINE: No polyuria, polydipsia, no heat or cold intolerance. No recent change in weight. HEMATOLOGICAL: No anemia or easy bruising or bleeding. NEUROLOGIC: No headache, seizures, numbness, tingling or weakness. PSYCHIATRIC: No depression, no loss of  interest in normal activity or change in sleep pattern.     Exam:   Ht 5' 6.5" (1.689 m)   Wt 150 lb (68 kg)   LMP 10/15/2014   BMI 23.85 kg/m   Body mass index is 23.85 kg/m.  General appearance : Well developed well nourished female. No acute distress HEENT: Eyes: no retinal hemorrhage or exudates,  Neck supple, trachea midline, no carotid bruits, no thyroidmegaly Lungs: Clear to auscultation, no rhonchi or wheezes, or rib retractions  Heart: Regular rate and rhythm, no murmurs or gallops Breast:Examined in sitting and supine position were symmetrical in appearance, no palpable masses or tenderness,  no skin retraction, no nipple inversion, no nipple discharge, no skin discoloration, no axillary or supraclavicular lymphadenopathy Abdomen: no palpable masses or tenderness, no rebound or guarding Extremities: no edema or skin discoloration or tenderness  Pelvic: Vulva: Normal             Vagina: No gross lesions or discharge  Cervix: No gross lesions or discharge.  Pap reflex done.  Uterus  AV, normal size, shape and consistency, non-tender and mobile  Adnexa  Without masses or tenderness  Anus: Normal   Assessment/Plan:  57 y.o. female for annual exam   1. Encounter for routine gynecological examination with Papanicolaou smear of cervix Postmenopause, well on no HRT.  No PMB.  No pelvic pain. Abstinent.  H/O Genital HSV, occasional recurrences. Pap  Neg/HPV HR neg in 11/2020. Pap reflex today. Breasts normal.  Mammo neg 12/2021, will schedule mammo now.  BMI 23.85.  Doing Yoga. BMD Osteopenia T-Score -1.3 stable on 01-16-21.  COLONOSCOPY: 09/2022.  Has had the Flu vaccine.  Wants to establish with a Fam MD. Will f/u here for health labs this year. - Cytology - PAP( [place])  2. Postmenopausal Postmenopause, well on no HRT.  No PMB.  No pelvic pain. Abstinent.   3. Osteopenia of neck of left femur Good nutrition. Ca++ 1.5 g/d, Vit D supplements. BMI 23.85.  Doing Yoga. BMD  Osteopenia T-Score -1.3 stable on 01-16-21.  Recommend repeating at 3 years.  4. HSV-2 (herpes simplex virus 2) infection Will continue with Valacyclovir for recurrences.  Prescription sent.  Other orders - Ascorbic Acid (VITAMIN C) 500 MG CAPS; Take by mouth. - cetirizine (ZYRTEC) 10 MG tablet; Take by mouth. - valACYclovir (VALTREX) 1000 MG tablet; Take 1 tablet (1,000 mg total) by mouth 2 (two) times daily for 5 days.   [name] MD, 8:34 AM

## 2022-12-17 LAB — CYTOLOGY - PAP: Diagnosis: NEGATIVE

## 2022-12-20 ENCOUNTER — Other Ambulatory Visit: Payer: BC Managed Care – PPO

## 2022-12-24 ENCOUNTER — Other Ambulatory Visit: Payer: BC Managed Care – PPO

## 2022-12-24 DIAGNOSIS — E785 Hyperlipidemia, unspecified: Secondary | ICD-10-CM | POA: Diagnosis not present

## 2022-12-24 DIAGNOSIS — D689 Coagulation defect, unspecified: Secondary | ICD-10-CM | POA: Diagnosis not present

## 2022-12-24 DIAGNOSIS — Z01419 Encounter for gynecological examination (general) (routine) without abnormal findings: Secondary | ICD-10-CM

## 2022-12-24 DIAGNOSIS — D6852 Prothrombin gene mutation: Secondary | ICD-10-CM | POA: Diagnosis not present

## 2022-12-24 DIAGNOSIS — E042 Nontoxic multinodular goiter: Secondary | ICD-10-CM | POA: Diagnosis not present

## 2022-12-28 LAB — CBC
HCT: 39.6 % (ref 35.0–45.0)
Hemoglobin: 13.4 g/dL (ref 11.7–15.5)
MCH: 31.5 pg (ref 27.0–33.0)
MCHC: 33.8 g/dL (ref 32.0–36.0)
MCV: 93 fL (ref 80.0–100.0)
MPV: 10.1 fL (ref 7.5–12.5)
Platelets: 211 10*3/uL (ref 140–400)
RBC: 4.26 10*6/uL (ref 3.80–5.10)
RDW: 12.4 % (ref 11.0–15.0)
WBC: 5.2 10*3/uL (ref 3.8–10.8)

## 2022-12-28 LAB — COMPREHENSIVE METABOLIC PANEL
AG Ratio: 1.8 (calc) (ref 1.0–2.5)
ALT: 22 U/L (ref 6–29)
AST: 23 U/L (ref 10–35)
Albumin: 4.4 g/dL (ref 3.6–5.1)
Alkaline phosphatase (APISO): 43 U/L (ref 37–153)
BUN: 13 mg/dL (ref 7–25)
CO2: 25 mmol/L (ref 20–32)
Calcium: 9 mg/dL (ref 8.6–10.4)
Chloride: 103 mmol/L (ref 98–110)
Creat: 0.6 mg/dL (ref 0.50–1.03)
Globulin: 2.4 g/dL (calc) (ref 1.9–3.7)
Glucose, Bld: 111 mg/dL — ABNORMAL HIGH (ref 65–99)
Potassium: 4.9 mmol/L (ref 3.5–5.3)
Sodium: 137 mmol/L (ref 135–146)
Total Bilirubin: 0.4 mg/dL (ref 0.2–1.2)
Total Protein: 6.8 g/dL (ref 6.1–8.1)

## 2022-12-28 LAB — LIPID PANEL
Cholesterol: 213 mg/dL — ABNORMAL HIGH (ref <200)
HDL: 102 mg/dL (ref >=50)
LDL Cholesterol (Calc): 97 mg/dL (calc)
Non-HDL Cholesterol (Calc): 111 mg/dL (calc) (ref <130)
Total CHOL/HDL Ratio: 2.1 (calc) (ref <5.0)
Triglycerides: 46 mg/dL (ref <150)

## 2022-12-28 LAB — HEMOGLOBIN A1C
Hgb A1c MFr Bld: 5.6 % of total Hgb (ref <5.7)
Mean Plasma Glucose: 114 mg/dL
eAG (mmol/L): 6.3 mmol/L

## 2022-12-28 LAB — TSH: TSH: 1.02 mIU/L (ref 0.40–4.50)

## 2022-12-28 LAB — VITAMIN D 25 HYDROXY (VIT D DEFICIENCY, FRACTURES): Vit D, 25-Hydroxy: 33 ng/mL (ref 30–100)

## 2023-01-13 DIAGNOSIS — M5127 Other intervertebral disc displacement, lumbosacral region: Secondary | ICD-10-CM | POA: Diagnosis not present

## 2023-01-13 DIAGNOSIS — M5386 Other specified dorsopathies, lumbar region: Secondary | ICD-10-CM | POA: Diagnosis not present

## 2023-01-13 DIAGNOSIS — M9901 Segmental and somatic dysfunction of cervical region: Secondary | ICD-10-CM | POA: Diagnosis not present

## 2023-01-13 DIAGNOSIS — M47812 Spondylosis without myelopathy or radiculopathy, cervical region: Secondary | ICD-10-CM | POA: Diagnosis not present

## 2023-01-27 ENCOUNTER — Other Ambulatory Visit: Payer: Self-pay | Admitting: Obstetrics & Gynecology

## 2023-01-27 DIAGNOSIS — Z1231 Encounter for screening mammogram for malignant neoplasm of breast: Secondary | ICD-10-CM

## 2023-01-27 DIAGNOSIS — M47812 Spondylosis without myelopathy or radiculopathy, cervical region: Secondary | ICD-10-CM | POA: Diagnosis not present

## 2023-01-27 DIAGNOSIS — M5386 Other specified dorsopathies, lumbar region: Secondary | ICD-10-CM | POA: Diagnosis not present

## 2023-01-27 DIAGNOSIS — M9902 Segmental and somatic dysfunction of thoracic region: Secondary | ICD-10-CM | POA: Diagnosis not present

## 2023-01-27 DIAGNOSIS — M9901 Segmental and somatic dysfunction of cervical region: Secondary | ICD-10-CM | POA: Diagnosis not present

## 2023-02-04 ENCOUNTER — Ambulatory Visit
Admission: RE | Admit: 2023-02-04 | Discharge: 2023-02-04 | Disposition: A | Discharge status: Home / Self Care | Payer: BC Managed Care – PPO | Source: Ambulatory Visit | Attending: Obstetrics & Gynecology | Admitting: Obstetrics & Gynecology

## 2023-02-04 DIAGNOSIS — Z1231 Encounter for screening mammogram for malignant neoplasm of breast: Secondary | ICD-10-CM | POA: Diagnosis not present

## 2023-04-03 DIAGNOSIS — M47814 Spondylosis without myelopathy or radiculopathy, thoracic region: Secondary | ICD-10-CM | POA: Diagnosis not present

## 2023-04-03 DIAGNOSIS — M5386 Other specified dorsopathies, lumbar region: Secondary | ICD-10-CM | POA: Diagnosis not present

## 2023-04-03 DIAGNOSIS — M9901 Segmental and somatic dysfunction of cervical region: Secondary | ICD-10-CM | POA: Diagnosis not present

## 2023-04-03 DIAGNOSIS — M47812 Spondylosis without myelopathy or radiculopathy, cervical region: Secondary | ICD-10-CM | POA: Diagnosis not present

## 2023-05-07 DIAGNOSIS — M47814 Spondylosis without myelopathy or radiculopathy, thoracic region: Secondary | ICD-10-CM | POA: Diagnosis not present

## 2023-05-07 DIAGNOSIS — M47812 Spondylosis without myelopathy or radiculopathy, cervical region: Secondary | ICD-10-CM | POA: Diagnosis not present

## 2023-05-07 DIAGNOSIS — M5127 Other intervertebral disc displacement, lumbosacral region: Secondary | ICD-10-CM | POA: Diagnosis not present

## 2023-05-07 DIAGNOSIS — M5386 Other specified dorsopathies, lumbar region: Secondary | ICD-10-CM | POA: Diagnosis not present

## 2023-05-30 ENCOUNTER — Ambulatory Visit (INDEPENDENT_AMBULATORY_CARE_PROVIDER_SITE_OTHER): Payer: BC Managed Care – PPO | Admitting: Podiatry

## 2023-05-30 ENCOUNTER — Ambulatory Visit (INDEPENDENT_AMBULATORY_CARE_PROVIDER_SITE_OTHER): Payer: BC Managed Care – PPO

## 2023-05-30 ENCOUNTER — Encounter: Payer: Self-pay | Admitting: Podiatry

## 2023-05-30 DIAGNOSIS — M2041 Other hammer toe(s) (acquired), right foot: Secondary | ICD-10-CM | POA: Diagnosis not present

## 2023-05-30 DIAGNOSIS — M7751 Other enthesopathy of right foot: Secondary | ICD-10-CM | POA: Diagnosis not present

## 2023-05-30 MED ORDER — TRIAMCINOLONE ACETONIDE 10 MG/ML IJ SUSP
10.0000 mg | Freq: Once | INTRAMUSCULAR | Status: AC
  Administered 2023-05-30: 10 mg via INTRA_ARTICULAR

## 2023-06-03 NOTE — Progress Notes (Signed)
Subjective:   Patient ID: Jessica [name], female   DOB: 57 y.o.   MRN: [id number]   HPI Patient states the fourth toe right is bothering her and the left 1 is doing better.  States it is inflamed in the joint and while the incision has healed excellent it still gets somewhat swollen   ROS      Objective:  Physical Exam  Neuro vascular status intact negative Denna Haggard' sign noted wound edges are very well coapted there is some inflammation in the inner phalangeal joint and slight enlargement around the lateral side where the head of the middle phalanx was removed     Assessment:  Inflammatory capsulitis with inflammation noted with what appears to be good healing from the previous distal arthroplasty with incision site healed beautifully     Plan:  H&P reviewed x-ray.  Organ to try conservative care I did do sterile prep I injected the inner phalangeal joint with 2 mg dexamethasone Kenalog to reduce the inflammation and advised on wider shoes if symptoms persist possible we may need to go back into this area  X-rays do indicate satisfactory resection head of middle phalanx digit for right

## 2023-07-21 DIAGNOSIS — H9313 Tinnitus, bilateral: Secondary | ICD-10-CM | POA: Diagnosis not present

## 2023-08-25 DIAGNOSIS — M47814 Spondylosis without myelopathy or radiculopathy, thoracic region: Secondary | ICD-10-CM | POA: Diagnosis not present

## 2023-08-25 DIAGNOSIS — M47812 Spondylosis without myelopathy or radiculopathy, cervical region: Secondary | ICD-10-CM | POA: Diagnosis not present

## 2023-08-25 DIAGNOSIS — M5127 Other intervertebral disc displacement, lumbosacral region: Secondary | ICD-10-CM | POA: Diagnosis not present

## 2023-08-25 DIAGNOSIS — M5386 Other specified dorsopathies, lumbar region: Secondary | ICD-10-CM | POA: Diagnosis not present

## 2023-09-04 DIAGNOSIS — M47812 Spondylosis without myelopathy or radiculopathy, cervical region: Secondary | ICD-10-CM | POA: Diagnosis not present

## 2023-09-04 DIAGNOSIS — M5127 Other intervertebral disc displacement, lumbosacral region: Secondary | ICD-10-CM | POA: Diagnosis not present

## 2023-09-04 DIAGNOSIS — M47814 Spondylosis without myelopathy or radiculopathy, thoracic region: Secondary | ICD-10-CM | POA: Diagnosis not present

## 2023-09-04 DIAGNOSIS — M5386 Other specified dorsopathies, lumbar region: Secondary | ICD-10-CM | POA: Diagnosis not present

## 2023-09-26 DIAGNOSIS — H10413 Chronic giant papillary conjunctivitis, bilateral: Secondary | ICD-10-CM | POA: Diagnosis not present

## 2023-11-12 DIAGNOSIS — M5127 Other intervertebral disc displacement, lumbosacral region: Secondary | ICD-10-CM | POA: Diagnosis not present

## 2023-11-12 DIAGNOSIS — M5386 Other specified dorsopathies, lumbar region: Secondary | ICD-10-CM | POA: Diagnosis not present

## 2023-11-12 DIAGNOSIS — M47812 Spondylosis without myelopathy or radiculopathy, cervical region: Secondary | ICD-10-CM | POA: Diagnosis not present

## 2023-11-12 DIAGNOSIS — M47814 Spondylosis without myelopathy or radiculopathy, thoracic region: Secondary | ICD-10-CM | POA: Diagnosis not present

## 2023-11-19 DIAGNOSIS — M47814 Spondylosis without myelopathy or radiculopathy, thoracic region: Secondary | ICD-10-CM | POA: Diagnosis not present

## 2023-11-19 DIAGNOSIS — M5127 Other intervertebral disc displacement, lumbosacral region: Secondary | ICD-10-CM | POA: Diagnosis not present

## 2023-11-19 DIAGNOSIS — M5386 Other specified dorsopathies, lumbar region: Secondary | ICD-10-CM | POA: Diagnosis not present

## 2023-11-19 DIAGNOSIS — M47812 Spondylosis without myelopathy or radiculopathy, cervical region: Secondary | ICD-10-CM | POA: Diagnosis not present

## 2023-12-18 ENCOUNTER — Ambulatory Visit (INDEPENDENT_AMBULATORY_CARE_PROVIDER_SITE_OTHER): Payer: BC Managed Care – PPO | Admitting: Family

## 2023-12-18 ENCOUNTER — Encounter: Payer: Self-pay | Admitting: Family

## 2023-12-18 ENCOUNTER — Ambulatory Visit: Payer: BC Managed Care – PPO | Admitting: Family

## 2023-12-18 ENCOUNTER — Other Ambulatory Visit: Payer: Self-pay | Admitting: Family

## 2023-12-18 VITALS — BP 117/65 | HR 62 | Temp 97.7°F | Resp 16 | Ht 66.5 in | Wt 147.0 lb

## 2023-12-18 DIAGNOSIS — Z23 Encounter for immunization: Secondary | ICD-10-CM | POA: Diagnosis not present

## 2023-12-18 DIAGNOSIS — Z1231 Encounter for screening mammogram for malignant neoplasm of breast: Secondary | ICD-10-CM

## 2023-12-18 DIAGNOSIS — E559 Vitamin D deficiency, unspecified: Secondary | ICD-10-CM

## 2023-12-18 DIAGNOSIS — Z13820 Encounter for screening for osteoporosis: Secondary | ICD-10-CM

## 2023-12-18 DIAGNOSIS — R7309 Other abnormal glucose: Secondary | ICD-10-CM

## 2023-12-18 DIAGNOSIS — M8580 Other specified disorders of bone density and structure, unspecified site: Secondary | ICD-10-CM

## 2023-12-18 DIAGNOSIS — E2839 Other primary ovarian failure: Secondary | ICD-10-CM

## 2023-12-18 DIAGNOSIS — Z0000 Encounter for general adult medical examination without abnormal findings: Secondary | ICD-10-CM | POA: Diagnosis not present

## 2023-12-18 DIAGNOSIS — Z13220 Encounter for screening for lipoid disorders: Secondary | ICD-10-CM

## 2023-12-18 MED ORDER — VALACYCLOVIR HCL 1 G PO TABS: 1000.0000 mg | ORAL_TABLET | Freq: Two times a day (BID) | ORAL | 0 refills | Status: DC | PRN

## 2023-12-18 NOTE — Progress Notes (Signed)
 Jessica [name] is a 58 y.o. 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     cetirizine (ZYRTEC) 10 MG tablet Take by mouth.     Multiple Vitamin (MULTIVITAMIN) tablet Take 1 tablet by mouth daily.     metoprolol tartrate (LOPRESSOR) 25 MG tablet Take 1 tablet (25 mg total) by mouth daily as needed. (Patient not taking: Reported on 10/10/2022) 90 tablet 3   valACYclovir (VALTREX) 1000 MG tablet Take 1 tablet (1,000 mg total) by mouth 2 (two) times daily as needed. 60 tablet 0   No current facility-administered medications for this visit.    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)    MVP (mitral valve prolapse)    PVC's (premature ventricular contractions)     Past Surgical History:  Procedure Laterality Date   BUNIONECTOMY     BOTH FEET   COLONOSCOPY     HAND SURGERY     HERNIA REPAIR     INGUINAL  HERNIA REPAIR     LEFT   PELVIC LAPAROSCOPY     ENDOMETRIOSIS   TONSILLECTOMY      Family History  Problem Relation Age of Onset   Colon polyps Father    Cancer Sister        breast   Breast cancer Sister        1's   Colon polyps Sister    Cancer Maternal Grandmother        OVARIAN   Diabetes Paternal Grandmother    Cancer Paternal Grandfather        COLON   Colon cancer Paternal Grandfather    Breast cancer Maternal Aunt        ? age of onset, young   Cancer Maternal Aunt        COLON   Colon cancer Maternal Aunt    Rectal cancer Paternal Uncle        colorectal cancer   Cancer Paternal Uncle        COLON   Colon cancer Paternal Uncle    Esophageal cancer Neg Hx    Stomach cancer Neg Hx     Social History   Tobacco Use   Smoking status: Former    Current packs/day: 0.00    Types: Cigarettes    Quit date: 01/18/2007    Years since quitting: 16.9  Smokeless tobacco: Never  Substance Use Topics   Alcohol use: Not Currently    Comment: occassional    Subjective:   Presents today as a new patient; needs to get yearly CPE; her GYN has relocated and patient opted to establish with PCP; no acute concerns today; will return for fasting labs at later date;   Review of Systems  Constitutional: Negative.   HENT: Negative.    Eyes: Negative.   Respiratory: Negative.    Cardiovascular: Negative.   Gastrointestinal: Negative.   Genitourinary: Negative.   Musculoskeletal: Negative.   Skin: Negative.   Neurological: Negative.   Endo/Heme/Allergies: Negative.   Psychiatric/Behavioral: Negative.        Objective:  Vitals:   12/18/23 1404  BP: 117/65  Pulse: 62  Resp: 16  Temp: 97.7 F (36.5 C)  TempSrc: Oral  SpO2: 100%  Weight: 147 lb (66.7 kg)  Height: 5' 6.5" (1.689 m)    General: Well developed, well nourished, in no acute distress  Skin : Warm and dry.  Head: Normocephalic and atraumatic  Eyes: Sclera and conjunctiva clear; pupils round and  reactive to light; extraocular movements intact  Ears: External normal; canals clear; tympanic membranes normal  Oropharynx: Pink, supple. No suspicious lesions  Neck: Supple without thyromegaly, adenopathy  Lungs: Respirations unlabored; clear to auscultation bilaterally without wheeze, rales, rhonchi  CVS exam: normal rate and regular rhythm.  Abdomen: Soft; nontender; nondistended; normoactive bowel sounds; no masses or hepatosplenomegaly  Musculoskeletal: No deformities; no active joint inflammation  Extremities: No edema, cyanosis, clubbing  Vessels: Symmetric bilaterally  Neurologic: Alert and oriented; speech intact; face symmetrical; moves all extremities well; CNII-XII intact without focal deficit   Assessment:  1. PE (physical exam), annual   2. Osteopenia, unspecified location   3. Ovarian failure   4. Osteoporosis screening   5. Lipid screening   6. Elevated glucose   7. Vitamin D deficiency   8. Need for Tdap vaccination     Plan:  Congratulated patient on commitment to her health; Age appropriate preventive healthcare needs addressed; encouraged regular eye doctor and dental exams; encouraged regular exercise; will update refills as needed today; she will return for fasting labs at her convenience; Tdap updated;    No follow-ups on file.  Orders Placed This Encounter  Procedures   DG Bone Density    BCBS ID [id number] Pf; 12/27/20 @ [facility]/ pt aware to stop cali- multi vit 48 hrs prior/ spoke with pt/ fj Wt; 147 Epic order    Standing Status:   Future    Expiration Date:   12/17/2024    Scheduling Instructions:     Please schedule with mammogram for April 2025    Reason for Exam (SYMPTOM  OR DIAGNOSIS REQUIRED):   osteopenia    Is patient pregnant?:   No    Preferred [facility]   Tdap vaccine greater than or equal to 7yo IM   CBC with Differential/Platelet    Standing Status:   Future    Expiration Date:   12/17/2024   Comp  Met (CMET)    Standing Status:   Future    Expiration Date:   12/17/2024   Lipid panel    Standing Status:   Future    Expiration Date:   12/17/2024   Hemoglobin A1c    Standing Status:   Future    Expiration Date:   12/17/2024   Vitamin D (25 hydroxy)    Standing Status:  Future    Expiration Date:   12/17/2024    Requested Prescriptions   Signed Prescriptions Disp Refills   valACYclovir (VALTREX) 1000 MG tablet 60 tablet 0    Sig: Take 1 tablet (1,000 mg total) by mouth 2 (two) times daily as needed.

## 2023-12-24 ENCOUNTER — Encounter: Payer: Self-pay | Admitting: Physician Assistant

## 2023-12-24 ENCOUNTER — Ambulatory Visit: Admitting: Physician Assistant

## 2023-12-24 ENCOUNTER — Ambulatory Visit: Payer: Self-pay | Admitting: Family

## 2023-12-24 VITALS — BP 118/64 | HR 56 | Temp 97.9°F | Ht 66.5 in | Wt 145.2 lb

## 2023-12-24 DIAGNOSIS — T50A95A Adverse effect of other bacterial vaccines, initial encounter: Secondary | ICD-10-CM | POA: Diagnosis not present

## 2023-12-24 MED ORDER — AMOXICILLIN 875 MG PO TABS: 875.0000 mg | ORAL_TABLET | Freq: Two times a day (BID) | ORAL | 0 refills | Status: AC

## 2023-12-24 NOTE — Progress Notes (Signed)
      Established patient visit   Patient: Jessica [name]   DOB: 1966/08/07   58 y.o. Female  MRN: [id number] Visit Date: 12/24/2023  Today's healthcare provider: Alfredia Ferguson, PA-C   Chief Complaint  Patient presents with   Follow-up    Redness and swelling at injection site onset 12/20/2023  "Its getting bigger and discolored"    Subjective     Pt had a tetanus vaccine 12/18/23. Initially no issues, but over the weekend developed pain, redness, swelling. Reports it is worsening, very painful to touch.   Medications: Outpatient Medications Prior to Visit  Medication Sig   Ascorbic Acid (VITAMIN C) 500 MG CAPS Take by mouth.   cetirizine (ZYRTEC) 10 MG tablet Take by mouth.   Multiple Vitamin (MULTIVITAMIN) tablet Take 1 tablet by mouth daily.   valACYclovir (VALTREX) 1000 MG tablet Take 1 tablet (1,000 mg total) by mouth 2 (two) times daily as needed.   metoprolol tartrate (LOPRESSOR) 25 MG tablet Take 1 tablet (25 mg total) by mouth daily as needed. (Patient not taking: Reported on 10/10/2022)   No facility-administered medications prior to visit.   Review of Systems  Constitutional:  Negative for fatigue and fever.  Respiratory:  Negative for cough and shortness of breath.   Cardiovascular:  Negative for chest pain and leg swelling.  Gastrointestinal:  Negative for abdominal pain.  Neurological:  Negative for dizziness and headaches.       Objective    BP 118/64 (BP Location: Left Arm, Patient Position: Sitting, Cuff Size: Normal)   Pulse (!) 56   Temp 97.9 F (36.6 C) (Oral)   Ht 5' 6.5" (1.689 m)   Wt 145 lb 3.2 oz (65.9 kg)   LMP 10/15/2014   SpO2 100%   BMI 23.08 kg/m    Physical Exam Vitals reviewed.  Constitutional:      Appearance: She is not ill-appearing.  HENT:     Head: Normocephalic.  Eyes:     Conjunctiva/sclera: Conjunctivae normal.  Cardiovascular:     Rate and Rhythm: Normal rate.  Pulmonary:     Effort: Pulmonary effort is  normal. No respiratory distress.  Skin:    Comments: L arm with mild erythema, but there is a 2-3 cm firm, almost fluctuant mass. Slightly warm to touch  Neurological:     Mental Status: She is alert and oriented to person, place, and time.  Psychiatric:        Mood and Affect: Mood normal.        Behavior: Behavior normal.     No results found for any visits on 12/24/23.  Assessment & Plan    Local reaction to tetanus vaccine, initial encounter -     Amoxicillin; Take 1 tablet (875 mg total) by mouth 2 (two) times daily for 7 days.  Dispense: 14 tablet; Refill: 0  Advise topical ice multiple times a day. -- rx amoxicillin, pt is going out of town this weekend.  if area grows, redness increases/ spreads, recommend taking abx. I would not call it an abscess today but concerned for progression.   Return if symptoms worsen or fail to improve.       Alfredia Ferguson, PA-C  [facility] [phone] (phone) [phone] (fax)  [facility]

## 2023-12-24 NOTE — Telephone Encounter (Signed)
 This RN made first attempt to contact patient. Phone rang once and then recording "Call cannot be completed as dialed" will place in callback folder.  Copied from CRM [id number]. Topic: Clinical - Medical Advice >> Dec 24, 2023 12:31 PM Jon Gills C wrote: Reason for CRM: Patient called in stating she had gotten a tetanus shot last week, and the spot is still very swollen and discolored. She is wanting for a nurse to give her a call regarding this issue

## 2023-12-24 NOTE — Telephone Encounter (Signed)
 This RN made third attempt to contact patient. Phone rang once and then recording "Call cannot be completed as dialed." Routing to office.

## 2023-12-24 NOTE — Telephone Encounter (Signed)
 Copied from CRM [id number]. Topic: Clinical - Medical Advice >> Dec 24, 2023 12:31 PM Jon Gills C wrote: Reason for CRM: Patient called in stating she had gotten a tetanus shot last week, and the spot is still very swollen and discolored. She is wanting for a nurse to give her a call regarding this issue   Chief Complaint: Swelling of injection site  Symptoms: Swelling, itchiness, redness, pain  Frequency: Constant  Pertinent Negatives: Patient denies fever, shortness of breath  Disposition: [] ED /[] Urgent Care (no appt availability in office) / [x] Appointment(In office/virtual)/ []  [facility]/ [] Home Care/ [] Refused Recommended Disposition /[] [facility]/ []  Follow-up with PCP Additional Notes: Patient states that she got a tetanus shot 6 days ago. She states that 4-5 days ago she noticed some swelling of the injection site that has been worsening. She states that the swelling is about half dollar in size, and is red, itchy, and sore. She denies any shortness of breath, fever, or other symptom at this time. Appointment made for today for evaluation.    Reason for Disposition  [1] Over 3 days (72 hours) since shot AND [2] redness, swelling or pain getting worse  Answer Assessment - Initial Assessment Questions 1. SYMPTOMS: "What is the main symptom?" (e.g., redness, swelling, pain)      Swelling, soreness, itchiness, redness  2. ONSET: "When was the vaccine (shot) given?" "How much later did the swelling begin?" (e.g., hours, days ago)      1-2 days after injection  3. SEVERITY: "How bad is it?"      Size of half dollar 4. FEVER: "Is there a fever?" If Yes, ask: "What is it, how was it measured, and when did it start?"      No 5. IMMUNIZATIONS GIVEN: "What shots have you recently received?"     Tetanus 6. PAST REACTIONS: "Have you reacted to immunizations before?" If Yes, ask: "What happened?"     No 7. OTHER SYMPTOMS: "Do you have any other symptoms?"      No  Protocols used: Immunization Reactions-A-AH

## 2023-12-24 NOTE — Telephone Encounter (Signed)
 This RN made second attempt to contact patient. Phone rang once and then recording "Call cannot be completed as dialed" will place in callback folder.

## 2023-12-26 ENCOUNTER — Other Ambulatory Visit (INDEPENDENT_AMBULATORY_CARE_PROVIDER_SITE_OTHER): Payer: BC Managed Care – PPO

## 2023-12-26 ENCOUNTER — Encounter: Payer: Self-pay | Admitting: Family

## 2023-12-26 DIAGNOSIS — Z0000 Encounter for general adult medical examination without abnormal findings: Secondary | ICD-10-CM | POA: Diagnosis not present

## 2023-12-26 DIAGNOSIS — Z13220 Encounter for screening for lipoid disorders: Secondary | ICD-10-CM | POA: Diagnosis not present

## 2023-12-26 DIAGNOSIS — E559 Vitamin D deficiency, unspecified: Secondary | ICD-10-CM | POA: Diagnosis not present

## 2023-12-26 DIAGNOSIS — R7309 Other abnormal glucose: Secondary | ICD-10-CM

## 2023-12-26 LAB — COMPREHENSIVE METABOLIC PANEL
ALT: 16 U/L (ref 0–35)
AST: 22 U/L (ref 0–37)
Albumin: 4.4 g/dL (ref 3.5–5.2)
Alkaline Phosphatase: 35 U/L — ABNORMAL LOW (ref 39–117)
BUN: 12 mg/dL (ref 6–23)
CO2: 27 meq/L (ref 19–32)
Calcium: 9.1 mg/dL (ref 8.4–10.5)
Chloride: 96 meq/L (ref 96–112)
Creatinine, Ser: 0.55 mg/dL (ref 0.40–1.20)
GFR: 101.38 mL/min (ref >60.00)
Glucose, Bld: 89 mg/dL (ref 70–99)
Potassium: 4.5 meq/L (ref 3.5–5.1)
Sodium: 133 meq/L — ABNORMAL LOW (ref 135–145)
Total Bilirubin: 0.5 mg/dL (ref 0.2–1.2)
Total Protein: 6.8 g/dL (ref 6.0–8.3)

## 2023-12-26 LAB — CBC WITH DIFFERENTIAL/PLATELET
Basophils Absolute: 0 10*3/uL (ref 0.0–0.1)
Basophils Relative: 0.8 % (ref 0.0–3.0)
Eosinophils Absolute: 0.1 10*3/uL (ref 0.0–0.7)
Eosinophils Relative: 1.8 % (ref 0.0–5.0)
HCT: 42.4 % (ref 36.0–46.0)
Hemoglobin: 14.3 g/dL (ref 12.0–15.0)
Lymphocytes Relative: 44.4 % (ref 12.0–46.0)
Lymphs Abs: 1.7 10*3/uL (ref 0.7–4.0)
MCHC: 33.7 g/dL (ref 30.0–36.0)
MCV: 94.1 fl (ref 78.0–100.0)
Monocytes Absolute: 0.4 10*3/uL (ref 0.1–1.0)
Monocytes Relative: 11.2 % (ref 3.0–12.0)
Neutro Abs: 1.6 10*3/uL (ref 1.4–7.7)
Neutrophils Relative %: 41.8 % — ABNORMAL LOW (ref 43.0–77.0)
Platelets: 205 10*3/uL (ref 150.0–400.0)
RBC: 4.5 Mil/uL (ref 3.87–5.11)
RDW: 12.1 % (ref 11.5–15.5)
WBC: 3.8 10*3/uL — ABNORMAL LOW (ref 4.0–10.5)

## 2023-12-26 LAB — LIPID PANEL
Cholesterol: 170 mg/dL (ref 0–200)
HDL: 78.6 mg/dL (ref >39.00)
LDL Cholesterol: 83 mg/dL (ref 0–99)
NonHDL: 91.34
Total CHOL/HDL Ratio: 2
Triglycerides: 41 mg/dL (ref 0.0–149.0)
VLDL: 8.2 mg/dL (ref 0.0–40.0)

## 2023-12-26 LAB — HEMOGLOBIN A1C: Hgb A1c MFr Bld: 5.6 % (ref 4.6–6.5)

## 2023-12-26 LAB — VITAMIN D 25 HYDROXY (VIT D DEFICIENCY, FRACTURES): VITD: 39.93 ng/mL (ref 30.00–100.00)

## 2023-12-29 ENCOUNTER — Other Ambulatory Visit: Payer: Self-pay | Admitting: Family

## 2023-12-29 DIAGNOSIS — R899 Unspecified abnormal finding in specimens from other organs, systems and tissues: Secondary | ICD-10-CM

## 2024-01-14 DIAGNOSIS — M5127 Other intervertebral disc displacement, lumbosacral region: Secondary | ICD-10-CM | POA: Diagnosis not present

## 2024-01-14 DIAGNOSIS — M5386 Other specified dorsopathies, lumbar region: Secondary | ICD-10-CM | POA: Diagnosis not present

## 2024-01-14 DIAGNOSIS — M47812 Spondylosis without myelopathy or radiculopathy, cervical region: Secondary | ICD-10-CM | POA: Diagnosis not present

## 2024-01-14 DIAGNOSIS — M47814 Spondylosis without myelopathy or radiculopathy, thoracic region: Secondary | ICD-10-CM | POA: Diagnosis not present

## 2024-01-21 DIAGNOSIS — L821 Other seborrheic keratosis: Secondary | ICD-10-CM | POA: Diagnosis not present

## 2024-01-21 DIAGNOSIS — D225 Melanocytic nevi of trunk: Secondary | ICD-10-CM | POA: Diagnosis not present

## 2024-01-21 DIAGNOSIS — L578 Other skin changes due to chronic exposure to nonionizing radiation: Secondary | ICD-10-CM | POA: Diagnosis not present

## 2024-01-21 DIAGNOSIS — L820 Inflamed seborrheic keratosis: Secondary | ICD-10-CM | POA: Diagnosis not present

## 2024-02-06 ENCOUNTER — Ambulatory Visit
Admission: RE | Admit: 2024-02-06 | Discharge: 2024-02-06 | Disposition: A | Discharge status: Home / Self Care | Payer: BC Managed Care – PPO | Source: Ambulatory Visit | Attending: Family | Admitting: Family

## 2024-02-06 DIAGNOSIS — Z1231 Encounter for screening mammogram for malignant neoplasm of breast: Secondary | ICD-10-CM

## 2024-02-19 DIAGNOSIS — L720 Epidermal cyst: Secondary | ICD-10-CM | POA: Diagnosis not present

## 2024-03-29 ENCOUNTER — Encounter: Payer: Self-pay | Admitting: Podiatry

## 2024-03-29 ENCOUNTER — Ambulatory Visit (INDEPENDENT_AMBULATORY_CARE_PROVIDER_SITE_OTHER): Admitting: Podiatry

## 2024-03-29 ENCOUNTER — Ambulatory Visit (INDEPENDENT_AMBULATORY_CARE_PROVIDER_SITE_OTHER)

## 2024-03-29 VITALS — Ht 63.0 in | Wt 145.0 lb

## 2024-03-29 DIAGNOSIS — M7751 Other enthesopathy of right foot: Secondary | ICD-10-CM | POA: Diagnosis not present

## 2024-03-29 DIAGNOSIS — M722 Plantar fascial fibromatosis: Secondary | ICD-10-CM | POA: Diagnosis not present

## 2024-03-29 MED ORDER — TRIAMCINOLONE ACETONIDE 10 MG/ML IJ SUSP
10.0000 mg | Freq: Once | INTRAMUSCULAR | Status: AC
  Administered 2024-03-29: 10 mg via INTRA_ARTICULAR

## 2024-03-31 NOTE — Progress Notes (Signed)
 Subjective:   Patient ID: Jessica [name], female   DOB: 58 y.o.   MRN: [id number]   HPI Patient presents stating that she is having a lot of pain in the right second joint of her foot and the fourth toe right is remaining stubborn and bothersome for   ROS      Objective:  Physical Exam  Neurovascular status intact mild swelling right fourth toe but I did not find any significant pathology with exquisite discomfort of the second metatarsal phalangeal joint right     Assessment:  Inflammatory capsulitis second MPJ right with digit fourth right that is difficult to ascertain as to why it remains tender     Plan:  H&P reviewed both conditions organ to focus on the second MPJ today I did proximal nerve block of the area proximal nerve block of the area I went ahead and I did sterile prep of the entire area I aspirated the joint getting out a small amount of clear fluid injected quarter cc Dexasone Kenalog  and discussed possible trying to remove scar tissue right fourth toe but concerned about doing this.  Patient will be seen back as symptoms indicate  X-rays were negative for signs of fracture or arthritis around the second MPJ the fourth digit continues to look good from a bony perspective

## 2024-04-19 ENCOUNTER — Ambulatory Visit (INDEPENDENT_AMBULATORY_CARE_PROVIDER_SITE_OTHER): Admitting: Podiatry

## 2024-04-19 ENCOUNTER — Encounter: Payer: Self-pay | Admitting: Podiatry

## 2024-04-19 DIAGNOSIS — M2041 Other hammer toe(s) (acquired), right foot: Secondary | ICD-10-CM | POA: Diagnosis not present

## 2024-04-19 DIAGNOSIS — M7751 Other enthesopathy of right foot: Secondary | ICD-10-CM | POA: Diagnosis not present

## 2024-04-19 NOTE — Progress Notes (Signed)
 Subjective:   Patient ID: Jessica [name], female   DOB: 58 y.o.   MRN: [id number]   HPI Patient presents today concerned about some discomfort on the dorsum of the left foot states the right foot is been doing well and the fourth toe still bothers her right foot   ROS      Objective:  Physical Exam  Neurovascular status intact mild inflammation around the third MPJ left foot localized to this area with fluid buildup in the area in the right foot has inflammation of the distal digit fourth right where previous surgery was done but no indications of significant clinical pathology     Assessment:  Probability for inflammation of the forefoot left but it is of less intensity than the right foot was with some type of irritation of the fourth toe occurring     Plan:  H&P reviewed both conditions and recommended rigid bottom shoes and soaks as needed with anti-inflammatories.  Do not recommend more aggressive treatment unless it persists or gets worse and I explained all this to patient and she will be seen back as needed

## 2024-04-21 DIAGNOSIS — M5127 Other intervertebral disc displacement, lumbosacral region: Secondary | ICD-10-CM | POA: Diagnosis not present

## 2024-04-21 DIAGNOSIS — M47812 Spondylosis without myelopathy or radiculopathy, cervical region: Secondary | ICD-10-CM | POA: Diagnosis not present

## 2024-04-21 DIAGNOSIS — M47814 Spondylosis without myelopathy or radiculopathy, thoracic region: Secondary | ICD-10-CM | POA: Diagnosis not present

## 2024-04-21 DIAGNOSIS — M5386 Other specified dorsopathies, lumbar region: Secondary | ICD-10-CM | POA: Diagnosis not present

## 2024-04-28 DIAGNOSIS — M47812 Spondylosis without myelopathy or radiculopathy, cervical region: Secondary | ICD-10-CM | POA: Diagnosis not present

## 2024-04-28 DIAGNOSIS — M5386 Other specified dorsopathies, lumbar region: Secondary | ICD-10-CM | POA: Diagnosis not present

## 2024-04-28 DIAGNOSIS — M47814 Spondylosis without myelopathy or radiculopathy, thoracic region: Secondary | ICD-10-CM | POA: Diagnosis not present

## 2024-04-28 DIAGNOSIS — M5127 Other intervertebral disc displacement, lumbosacral region: Secondary | ICD-10-CM | POA: Diagnosis not present

## 2024-04-29 ENCOUNTER — Ambulatory Visit (HOSPITAL_BASED_OUTPATIENT_CLINIC_OR_DEPARTMENT_OTHER)
Admission: RE | Admit: 2024-04-29 | Discharge: 2024-04-29 | Disposition: A | Discharge status: Home / Self Care | Source: Ambulatory Visit | Attending: Family | Admitting: Family

## 2024-04-29 DIAGNOSIS — E2839 Other primary ovarian failure: Secondary | ICD-10-CM | POA: Insufficient documentation

## 2024-04-29 DIAGNOSIS — M8580 Other specified disorders of bone density and structure, unspecified site: Secondary | ICD-10-CM | POA: Diagnosis not present

## 2024-04-29 DIAGNOSIS — Z13820 Encounter for screening for osteoporosis: Secondary | ICD-10-CM | POA: Diagnosis not present

## 2024-04-29 DIAGNOSIS — M8589 Other specified disorders of bone density and structure, multiple sites: Secondary | ICD-10-CM | POA: Diagnosis not present

## 2024-04-29 DIAGNOSIS — Z780 Asymptomatic menopausal state: Secondary | ICD-10-CM | POA: Diagnosis not present

## 2024-04-30 ENCOUNTER — Ambulatory Visit: Payer: Self-pay | Admitting: Family

## 2024-05-12 DIAGNOSIS — M47814 Spondylosis without myelopathy or radiculopathy, thoracic region: Secondary | ICD-10-CM | POA: Diagnosis not present

## 2024-05-12 DIAGNOSIS — M5386 Other specified dorsopathies, lumbar region: Secondary | ICD-10-CM | POA: Diagnosis not present

## 2024-05-12 DIAGNOSIS — M47812 Spondylosis without myelopathy or radiculopathy, cervical region: Secondary | ICD-10-CM | POA: Diagnosis not present

## 2024-05-12 DIAGNOSIS — M5127 Other intervertebral disc displacement, lumbosacral region: Secondary | ICD-10-CM | POA: Diagnosis not present

## 2024-05-22 ENCOUNTER — Encounter: Payer: Self-pay | Admitting: Family

## 2024-05-24 ENCOUNTER — Other Ambulatory Visit: Payer: Self-pay | Admitting: Family

## 2024-05-25 NOTE — Telephone Encounter (Signed)
 Copied from CRM [id number]. Topic: Clinical - Prescription Issue >> May 25, 2024  7:49 AM Pinkey ORN wrote: Reason for CRM: Valacyclovir  >> May 25, 2024  7:51 AM Pinkey ORN wrote: Patient called in states that [facility] has been contacting you all about filling this prescription. Patient is requesting this be completed and given a call back once done so. Patient call back number [phone]

## 2024-06-11 ENCOUNTER — Telehealth: Payer: Self-pay

## 2024-06-11 NOTE — Telephone Encounter (Signed)
 Copied from CRM #[id number]. Topic: General - Other >> Jun 10, 2024  4:07 PM Rosina BIRCH wrote: Reason for CRM: patient called stating she went and got a bone density test done and it was coded as diagnostic but it should have been Preventive CPT code. Insurance company stated it is coded wrong   CB [phone]/07/02

## 2024-06-17 DIAGNOSIS — M5386 Other specified dorsopathies, lumbar region: Secondary | ICD-10-CM | POA: Diagnosis not present

## 2024-06-17 DIAGNOSIS — M5127 Other intervertebral disc displacement, lumbosacral region: Secondary | ICD-10-CM | POA: Diagnosis not present

## 2024-06-17 DIAGNOSIS — M47814 Spondylosis without myelopathy or radiculopathy, thoracic region: Secondary | ICD-10-CM | POA: Diagnosis not present

## 2024-06-17 DIAGNOSIS — M47812 Spondylosis without myelopathy or radiculopathy, cervical region: Secondary | ICD-10-CM | POA: Diagnosis not present

## 2024-08-12 ENCOUNTER — Ambulatory Visit (INDEPENDENT_AMBULATORY_CARE_PROVIDER_SITE_OTHER): Admitting: Podiatry

## 2024-08-12 ENCOUNTER — Ambulatory Visit

## 2024-08-12 ENCOUNTER — Encounter: Payer: Self-pay | Admitting: Podiatry

## 2024-08-12 DIAGNOSIS — M2041 Other hammer toe(s) (acquired), right foot: Secondary | ICD-10-CM

## 2024-08-12 DIAGNOSIS — M216X9 Other acquired deformities of unspecified foot: Secondary | ICD-10-CM

## 2024-08-13 NOTE — Progress Notes (Signed)
 Subjective:   Patient ID: Jessica [name], female   DOB: 58 y.o.   MRN: [id number]   HPI Patient presents with significant hammertoe deformity second digit right that she feels is getting worse and also still has problems with the fourth digit right that has been corrected several years ago.  States that it seems the pain is getting worse and also the joint hurts   ROS      Objective:  Physical Exam  Neurovascular status intact with further medial movement of the second digit right with moderate elevation which had already started when I saw this patient in June but has increased gradually over that time.  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  She wants to get this done I allowed her to read and then signed a consent form going over this and I had her sign and then gave her instructions for surgery again for her Reviewed all indications for procedure and what will be need to be done and she is scheduled for outpatient procedure  X-rays dated today indicated there is gradual increase in the digital deformity over the last 4 months with major changes from where it was 4 years ago when compared.  Left does not appear to be going through the same cycle

## 2024-08-16 ENCOUNTER — Telehealth: Payer: Self-pay | Admitting: Podiatry

## 2024-08-16 NOTE — Telephone Encounter (Signed)
 Called patient and surgery is scheduled for 09/14/2024. Patient is not on any blood thinner or GLP1 medications. Patient confirmed receipt of surgery bag and is aware [facility] will call 24-48 hours [name] to surgery with arrival time. Patient pharmacy is set in chart- Jessica [name] on [address].

## 2024-08-17 ENCOUNTER — Telehealth: Payer: Self-pay | Admitting: Podiatry

## 2024-08-17 NOTE — Telephone Encounter (Signed)
 DOS- 09/14/2024  2ND METATARSAL OSTEOTOMY RT- 28308 2ND AND 4TH HAMMERTOE REPAIR RT- 28285  [employer] EFFECTIVE DATE- 10/22/2023  DEDUCTIBLE- $4000 REMAINING- $2901.43 OOP- $4000 REMAINING- $2901.43 COINSURANCE- 0%  PER [employer] PORTAL, NO PRIOR AUTHS ARE REQUIRED FOR CPT CODES 71691 AND 71714. DOCUMENTATION ATTACHED TO SURGERY PACKET.

## 2024-08-18 DIAGNOSIS — M5127 Other intervertebral disc displacement, lumbosacral region: Secondary | ICD-10-CM | POA: Diagnosis not present

## 2024-08-18 DIAGNOSIS — M47814 Spondylosis without myelopathy or radiculopathy, thoracic region: Secondary | ICD-10-CM | POA: Diagnosis not present

## 2024-08-18 DIAGNOSIS — M47812 Spondylosis without myelopathy or radiculopathy, cervical region: Secondary | ICD-10-CM | POA: Diagnosis not present

## 2024-08-18 DIAGNOSIS — M5386 Other specified dorsopathies, lumbar region: Secondary | ICD-10-CM | POA: Diagnosis not present

## 2024-08-20 ENCOUNTER — Other Ambulatory Visit: Payer: BC Managed Care – PPO

## 2024-08-22 ENCOUNTER — Encounter: Payer: Self-pay | Admitting: Podiatry

## 2024-09-09 DIAGNOSIS — M47814 Spondylosis without myelopathy or radiculopathy, thoracic region: Secondary | ICD-10-CM | POA: Diagnosis not present

## 2024-09-09 DIAGNOSIS — M47812 Spondylosis without myelopathy or radiculopathy, cervical region: Secondary | ICD-10-CM | POA: Diagnosis not present

## 2024-09-09 DIAGNOSIS — M5127 Other intervertebral disc displacement, lumbosacral region: Secondary | ICD-10-CM | POA: Diagnosis not present

## 2024-09-09 DIAGNOSIS — M5386 Other specified dorsopathies, lumbar region: Secondary | ICD-10-CM | POA: Diagnosis not present

## 2024-09-13 ENCOUNTER — Telehealth: Payer: Self-pay | Admitting: Podiatry

## 2024-09-13 MED ORDER — ONDANSETRON HCL 4 MG PO TABS: 4.0000 mg | ORAL_TABLET | Freq: Three times a day (TID) | ORAL | 0 refills | Status: AC | PRN

## 2024-09-13 MED ORDER — HYDROCODONE-ACETAMINOPHEN 10-325 MG PO TABS: 1.0000 | ORAL_TABLET | Freq: Three times a day (TID) | ORAL | 0 refills | Status: DC | PRN

## 2024-09-13 NOTE — Telephone Encounter (Signed)
 Patient is scheduled for surgery on 09/14/2024. She is asking that she only have enough pain medication sent in to last 3 days. Patient states that she doesn't take more than 3 days worth usually and does not like having to throw away extra. Patient also asking if there is any way to send in this afternoon so she can pick up prior to surgery.

## 2024-09-13 NOTE — Telephone Encounter (Signed)
 I sent her medicine earlier. I missed the note so I did call in 15

## 2024-09-13 NOTE — Addendum Note (Signed)
 Addended by: MAGDALEN PASCO RAMAN on: 09/13/2024 05:07 PM   Modules accepted: Orders

## 2024-09-14 ENCOUNTER — Other Ambulatory Visit: Payer: Self-pay | Admitting: Podiatry

## 2024-09-14 DIAGNOSIS — M216X1 Other acquired deformities of right foot: Secondary | ICD-10-CM | POA: Diagnosis not present

## 2024-09-14 DIAGNOSIS — M2041 Other hammer toe(s) (acquired), right foot: Secondary | ICD-10-CM | POA: Diagnosis not present

## 2024-09-14 DIAGNOSIS — M21541 Acquired clubfoot, right foot: Secondary | ICD-10-CM | POA: Diagnosis not present

## 2024-09-14 MED ORDER — TRAMADOL HCL 50 MG PO TABS: 50.0000 mg | ORAL_TABLET | Freq: Three times a day (TID) | ORAL | 0 refills | Status: AC | PRN

## 2024-09-14 NOTE — Telephone Encounter (Signed)
 Patient called in regards to pain medication. She states that she can not take the Norco as she has nausea when taking this medication. I see where the zofran  was called in for her as well for nausea but she is not wanting to take the Norco at all and is asking for a alternative to be called in. She states that whatever medication was prescribed for her last surgery she tolerated well, looks like that was tramadol . Can we please send an alternative.

## 2024-09-20 ENCOUNTER — Encounter: Payer: Self-pay | Admitting: Podiatry

## 2024-09-20 ENCOUNTER — Ambulatory Visit

## 2024-09-20 ENCOUNTER — Ambulatory Visit (INDEPENDENT_AMBULATORY_CARE_PROVIDER_SITE_OTHER): Admitting: Podiatry

## 2024-09-20 DIAGNOSIS — M2041 Other hammer toe(s) (acquired), right foot: Secondary | ICD-10-CM

## 2024-09-20 NOTE — Progress Notes (Signed)
 Subjective:   Patient ID: Jessica [name], female   DOB: 59 y.o.   MRN: [id number]   HPI Patient presents stating doing well very pleased   ROS      Objective:  Physical Exam  Neurovascular status intact negative Toula' sign noted wound edges coapted well pin intact second digit fourth toe good alignment wound edges have slight Brabio but that is just due to the fact we did internal stitches     Assessment:  Doing well post digital fusion osteotomy and arthroplasty     Plan:  H&P x-ray reviewed sterile dressing reapplied continue elevation compression immobilization reappoint 2 weeks suture removal and x-ray  X-rays indicate osteotomy is healing well fixation in place toe in good alignment

## 2024-09-22 ENCOUNTER — Ambulatory Visit

## 2024-09-22 ENCOUNTER — Ambulatory Visit: Admitting: Podiatry

## 2024-09-22 ENCOUNTER — Encounter: Payer: Self-pay | Admitting: Podiatry

## 2024-09-22 ENCOUNTER — Telehealth: Payer: Self-pay | Admitting: Podiatry

## 2024-09-22 ENCOUNTER — Encounter: Admitting: Podiatry

## 2024-09-22 DIAGNOSIS — M2041 Other hammer toe(s) (acquired), right foot: Secondary | ICD-10-CM | POA: Diagnosis not present

## 2024-09-22 NOTE — Telephone Encounter (Signed)
 Patient reports her surgical toe is sticking up really bad. She attempted wrapping as previously instructed, but it will not stay in place. She is very concerned and is requesting to see only Dr. Magdalen today. I advised her to upload a photo of the toe, and I have scheduled her for a same-day appointment at 4:30 PM with the provider. Please advise if further action is needed. Thank you.

## 2024-09-22 NOTE — Telephone Encounter (Signed)
 Patient is scheduled for an appointment in 15 minutes.

## 2024-09-22 NOTE — Telephone Encounter (Signed)
 Pt appt time change to 3:15p per [facility]

## 2024-09-23 ENCOUNTER — Telehealth: Payer: Self-pay

## 2024-09-23 NOTE — Telephone Encounter (Signed)
 Copied from CRM #[id number]. Topic: Clinical - Medication Question >> Sep 23, 2024  1:59 PM Pinkey ORN wrote: Reason for CRM: Medication Refills >> Sep 23, 2024  2:01 PM Pinkey ORN wrote: Patient wants to know if there's anyone in particular that's handling Jessica Leita Repine, FNP patient's when it comes to medication refills. Patient states she's in need of refills and doesn't have a TOC appointment until March. Please follow up with patient.

## 2024-09-23 NOTE — Telephone Encounter (Signed)
 Not enough information. What meds are needing refilled? Error CRM sent.

## 2024-09-24 ENCOUNTER — Other Ambulatory Visit: Payer: Self-pay | Admitting: Family

## 2024-09-24 MED ORDER — VALACYCLOVIR HCL 1 G PO TABS: 1000.0000 mg | ORAL_TABLET | Freq: Two times a day (BID) | ORAL | 0 refills | Status: AC | PRN

## 2024-09-24 NOTE — Telephone Encounter (Signed)
 Pt has TOC appt scheduled for 12/2024, she is requesting a refill on valacyclovir  500mg , it is no longer on her med list. Please advise.

## 2024-09-24 NOTE — Telephone Encounter (Signed)
 Jessica [name]   09/23/2024  6:13 PM  Call ID [id number]. Patient called to schedule TOC appointment and inquire about medication refill. Specialist scheduled TOC appointment and sent over CRM regarding med refill but did not include the name of the medication. Name of medication was Valcyclovir. Resolving as e2c2 error. Thank you for your submission.

## 2024-09-25 NOTE — Progress Notes (Signed)
 Subjective:   Patient ID: Jessica [name], female   DOB: 58 y.o.   MRN: [id number]   HPI Patient presents concerned about the second digit and whether it is being kept down far enough and she is worried that it wants to pop up without dressing neuro   ROS      Objective:  Physical Exam  Vascular status intact negative Toula' sign noted second digit right actually looks excellent with the dressing that has been put on and appears to be in good position and being stretched at the current time.     Assessment:  Difficult correction with digital procedures with elevation that naturally occurs that were trying to reduce with bandaging technique during the postop.     Plan:  Precautionary x-ray taken I am satisfied with position continue to keep the toe plantarflexed reappoint for regular visit  X-ray indicates the fixation is in place the toe is in good alignment screw in good alignment currently

## 2024-10-04 ENCOUNTER — Ambulatory Visit (INDEPENDENT_AMBULATORY_CARE_PROVIDER_SITE_OTHER)

## 2024-10-04 ENCOUNTER — Ambulatory Visit (INDEPENDENT_AMBULATORY_CARE_PROVIDER_SITE_OTHER): Admitting: Podiatry

## 2024-10-04 ENCOUNTER — Encounter: Payer: Self-pay | Admitting: Podiatry

## 2024-10-04 DIAGNOSIS — M2041 Other hammer toe(s) (acquired), right foot: Secondary | ICD-10-CM

## 2024-10-04 NOTE — Progress Notes (Signed)
 Subjective:   Patient ID: Jessica [name], female   DOB: 58 y.o.   MRN: [id number]   HPI Patient presents stating doing very well very pleased keeping the second toe down and seen by nurse today for suture removal   ROS      Objective:  Physical Exam  Neurovascular status good all incisions intact good alignment pin intact     Assessment:  Doing well     Plan:  Stitches are removed and x-rays were evaluated indicating good alignment fixation in place good positional component.  Sterile dressing applied

## 2024-10-04 NOTE — Progress Notes (Signed)
 Patient presents for post-op visit today, POV #2 DOS 09/14/2024 RT 2ND METATRASAL OSTEOTOMY, RT 2ND AND 4TH HAMMER TOE REPAIR  Doing great. I have hope that the 2nd toe will go down when the pin is removed. There is some soreness in the second toe.  RN Notes: n/a  Vital Signs: Today's Vitals   10/04/24 1128  PainSc: 0-No pain      Radiographs: [x]  Taken []  Not taken  Surgical Site Assessment:  - Dressing:  [x]  Minimal dry blood, intact []  Reinforced   [x]  Changed     -RN Notes:   - Incision:  [x]  CDI (clean, dry, intact)  [x]  Mild erythema  []  Drainage noted   -RN Notes: n/a  - Swelling:  []  None  [x]  Mild  []  Moderate   []  Significant     -RN Notes: n/a  - Bruising:  [x]  None  []  Present: n/a   - Sutures/Staples:  []  None [x]  Intact  [x]  Removed Today  []  Plan to remove at next visit   -Cast/Splint/Pins: []  None [x]  Intact []  Removed Today [x]  Plan to remove at next visit []  Replaced  -Signs of infection:  [x]  None  []  Present - Describe: n/a  -DME:    []  None []  AFW [x]  Surgical shoe []  Cast  []  Splint  -Walking status:  [x]  Full WB  []  Partial WB  []  NWB  -Utilizing device:  [x]  None []  Knee Scooter []  Crutches []  Wheelchair    DVT assessment:  [x]  Denies symptoms []  Chest pain/SOB []  Pain in calf/redness/warmth   Redressed DSD and ace wrap. Educated on signs of infection, proper dressing care, pain management, and weight bearing status. Patient will contact provider with any new or worsening symptoms. The provider assessed the patient today and reviewed instructions regarding plan of care.

## 2024-10-18 ENCOUNTER — Encounter: Payer: Self-pay | Admitting: Podiatry

## 2024-10-18 ENCOUNTER — Ambulatory Visit (INDEPENDENT_AMBULATORY_CARE_PROVIDER_SITE_OTHER): Admitting: Podiatry

## 2024-10-18 ENCOUNTER — Ambulatory Visit (INDEPENDENT_AMBULATORY_CARE_PROVIDER_SITE_OTHER)

## 2024-10-18 DIAGNOSIS — M2041 Other hammer toe(s) (acquired), right foot: Secondary | ICD-10-CM | POA: Diagnosis not present

## 2024-10-19 DIAGNOSIS — M5127 Other intervertebral disc displacement, lumbosacral region: Secondary | ICD-10-CM | POA: Diagnosis not present

## 2024-10-19 DIAGNOSIS — M5386 Other specified dorsopathies, lumbar region: Secondary | ICD-10-CM | POA: Diagnosis not present

## 2024-10-19 DIAGNOSIS — M47814 Spondylosis without myelopathy or radiculopathy, thoracic region: Secondary | ICD-10-CM | POA: Diagnosis not present

## 2024-10-19 DIAGNOSIS — M47812 Spondylosis without myelopathy or radiculopathy, cervical region: Secondary | ICD-10-CM | POA: Diagnosis not present

## 2024-10-19 NOTE — Progress Notes (Signed)
 Subjective:   Patient ID: Jessica [name], female   DOB: 58 y.o.   MRN: [id number]   HPI Patient concerned because the second toe still left sob but overall healing well pin is in place with alignment good and good movement of the toe   ROS      Objective:  Physical Exam  Vascular status intact negative Toula' sign noted with the pin in place second toe is in good position nonweightbearing weightbearing the big toe third toe do tend to push it up slightly     Assessment:  Overall doing well will continue to work lower the second toe with pin in place to     Plan:  Pin removed second digit sterile dressing applied and gave device to lower the second toe continue to work at and hopefully it will completely come down over time but there is some pressure on it from the big toe third toe  X-rays indicate satisfactory alignment second digit and it does appear to be in very good alignment on the x-ray report and not lifting significantly

## 2024-10-25 ENCOUNTER — Telehealth: Payer: Self-pay | Admitting: Lab

## 2024-10-25 NOTE — Telephone Encounter (Signed)
 Patient left message requesting physical therapy for floating toe please review and advise.

## 2024-11-03 NOTE — Telephone Encounter (Signed)
 We can send to [facility]

## 2024-11-10 ENCOUNTER — Other Ambulatory Visit: Payer: Self-pay | Admitting: Podiatry

## 2024-11-10 NOTE — Telephone Encounter (Signed)
 Order was sent to [facility] last week. Let Jessica [name] know she should hear from them

## 2024-11-10 NOTE — Addendum Note (Signed)
 Addended by: Chloris Marcoux L on: 11/10/2024 01:22 PM   Modules accepted: Orders

## 2024-11-18 ENCOUNTER — Ambulatory Visit: Admitting: Podiatry

## 2024-11-18 ENCOUNTER — Encounter: Payer: Self-pay | Admitting: Podiatry

## 2024-11-18 ENCOUNTER — Ambulatory Visit (INDEPENDENT_AMBULATORY_CARE_PROVIDER_SITE_OTHER)

## 2024-11-18 DIAGNOSIS — M2041 Other hammer toe(s) (acquired), right foot: Secondary | ICD-10-CM

## 2024-11-19 NOTE — Progress Notes (Signed)
 Subjective:   Patient ID: Jessica [name], female   DOB: 59 y.o.   MRN: [id number]   HPI Patient presents concerned about the elevation of her second digit in a weightbearing position and states that she is having trouble with yoga and certain other activities.  States that she is using a brace to lower the toe that we have given to her at last visit and she is using creams on the incision site   ROS      Objective:  Physical Exam  Neurovascular status intact negative Toula' sign was noted right.  The second digit in a nonweightbearing state looks excellent weightbearing it does elevate and there is some lateral movement of the hallux against the second toe.  Fourth toe is mildly edematous still which is normal but she is not having any pain associated with it.  Incision sites are healing well with no thickening noted at this time     Assessment:  Elevation of the second digit postop in a weightbearing stance but still very early in the recovery process with slight medial deviation of the hallux against the toe     Plan:  H&P done x-ray reviewed and discussed this condition.  I do think it is still early in recovery and I am encouraging her to continue to work the second toe and we discussed physical therapy and she would rather self because of cost concerns.  She is doing a good job of continuing to stretch this toe and I am optimistic it will calm down but it is possible it will not and I did discuss with her the possibility for procedure to try to reduce the elevation of the toe with consideration of flexor tendon transfer dorsally and possibility for Aiken osteotomy.  I do think it is too early in the recovery and she will continue with her stretching exercises and be reevaluated again in 8 weeks  X-rays indicate that the bone structure is healthy with good position of the second metatarsal with second digit appearing to be in a fused position but it does moderately elevate on the  weightbearing lateral view

## 2025-01-03 ENCOUNTER — Encounter: Admitting: Family Medicine

## 2025-01-17 ENCOUNTER — Ambulatory Visit: Admitting: Podiatry
